# Patient Record
Sex: Female | Born: 1965 | Race: White | Hispanic: No | Marital: Married | Smoking: Never smoker
Health system: Southern US, Community
[De-identification: ages and names within clinical notes are randomized; demographics above are authoritative.]

## PROBLEM LIST (undated history)

## (undated) DIAGNOSIS — K589 Irritable bowel syndrome without diarrhea: Secondary | ICD-10-CM

## (undated) DIAGNOSIS — N920 Excessive and frequent menstruation with regular cycle: Secondary | ICD-10-CM

## (undated) DIAGNOSIS — E785 Hyperlipidemia, unspecified: Secondary | ICD-10-CM

## (undated) DIAGNOSIS — E538 Deficiency of other specified B group vitamins: Secondary | ICD-10-CM

## (undated) DIAGNOSIS — D219 Benign neoplasm of connective and other soft tissue, unspecified: Secondary | ICD-10-CM

## (undated) HISTORY — DX: Benign neoplasm of connective and other soft tissue, unspecified: D21.9

## (undated) HISTORY — DX: Excessive and frequent menstruation with regular cycle: N92.0

## (undated) HISTORY — DX: Deficiency of other specified B group vitamins: E53.8

## (undated) HISTORY — PX: ENDOMETRIAL ABLATION: SHX621

## (undated) HISTORY — DX: Morbid (severe) obesity due to excess calories: E66.01

## (undated) HISTORY — DX: Irritable bowel syndrome without diarrhea: K58.9

## (undated) HISTORY — DX: Hyperlipidemia, unspecified: E78.5

---

## 2006-01-18 ENCOUNTER — Inpatient Hospital Stay

## 2009-06-20 DIAGNOSIS — K589 Irritable bowel syndrome without diarrhea: Secondary | ICD-10-CM

## 2009-06-20 HISTORY — DX: Irritable bowel syndrome, unspecified: K58.9

## 2009-06-20 HISTORY — DX: Morbid (severe) obesity due to excess calories: E66.01

## 2009-07-11 ENCOUNTER — Inpatient Hospital Stay

## 2009-07-11 NOTE — Unmapped (Signed)
Signed by   LinkLogic on 07/16/2009 at 16:55:26  Patient: Karen Graves  Note: All result statuses are Final unless otherwise noted.    Tests: (1) MAMM-SCREENING DIRECT DIGITAL [phone]                 Negative (R)      [employer] Accession Number: [id number]                                      [facility]     Patient: Karen Graves, Karen Graves   DOB:     17-Nov-1965   MRN:     [id number]   FIN:       [id number]   Accn#:   [id number]                                       Mammography     Exam                       Exam Date/Time       Ordering Physician   MAMM-SCREENING DIRECT      07/11/2009 16:24 EDT STEPHENS, Marveen Reeks   DIGITAL     Reason for Exam   screen     Report   DIRECT DIGITAL BILATERAL SCREENING MAMMOGRAM WITH CAD 07/11/2009     CLINICAL INDICATION:  Breast cancer screening.     COMPARISON: 01/18/2006.     TECHNICAL FACTORS: Standard MLO and CC views of both breasts are obtained   using digital mammography and double read with R2 CAD ImageChecker.     FINDINGS:     There are scattered fibroglandular densities.     There is no significant interval change.     No focal mass, asymmetry, architectural distortion, or suspiciously   clustered microcalcification is seen.     IMPRESSION:     No mammographic evidence of malignancy.     RECOMMENDATION:     Routine annual bilateral screening mammogram in 1 year and annual clinical       breast examination.     Assessment:  1-Negative   Recommendation:  Normal interval follow-up             ********** VERIFIED REPORT **********     Dictated: 07/14/2009 10:44 pm     LEE M.D., Garret Reddish   Signed (Electronic Signature):  LEE M.D., SU-JU                 07/16/09   4:55   Transcribed by: Doristine Section   Technologist: EGW        EMR Routing to: CRAIG, JEFFREY L - copy to - [id number]    Note: An exclamation mark (!) indicates a result that was not dispersed into   the flowsheet.  Document Creation Date: 07/16/2009 4:55  PM  _______________________________________________________________________    (1) Order result status: Final  Collection or observation date-time: 07/11/2009 16:24:37  Requested date-time: 07/11/2009 16:09:00  Receipt date-time:   Reported date-time: 07/16/2009 16:55:15  Referring Physician: Renee Ramus  Ordering Physician:  Non-EMR Physician [facility])  Specimen Source: RAD&Rad Type  Source: QRS  Filler Order Number: [id number]  Lab site: [facility]

## 2010-07-14 ENCOUNTER — Inpatient Hospital Stay

## 2010-07-21 ENCOUNTER — Inpatient Hospital Stay

## 2010-09-17 DIAGNOSIS — N920 Excessive and frequent menstruation with regular cycle: Secondary | ICD-10-CM

## 2010-09-17 HISTORY — DX: Excessive and frequent menstruation with regular cycle: N92.0

## 2012-01-26 ENCOUNTER — Inpatient Hospital Stay

## 2012-03-13 ENCOUNTER — Inpatient Hospital Stay: Admit: 2012-03-13 | Payer: PRIVATE HEALTH INSURANCE

## 2012-03-13 DIAGNOSIS — Z1231 Encounter for screening mammogram for malignant neoplasm of breast: Secondary | ICD-10-CM

## 2013-04-13 ENCOUNTER — Ambulatory Visit: Payer: PRIVATE HEALTH INSURANCE

## 2013-05-04 ENCOUNTER — Inpatient Hospital Stay: Admit: 2013-05-04 | Payer: PRIVATE HEALTH INSURANCE

## 2013-05-04 DIAGNOSIS — Z1231 Encounter for screening mammogram for malignant neoplasm of breast: Secondary | ICD-10-CM

## 2014-07-26 ENCOUNTER — Inpatient Hospital Stay: Admit: 2014-07-26 | Payer: PRIVATE HEALTH INSURANCE

## 2014-07-26 DIAGNOSIS — Z1231 Encounter for screening mammogram for malignant neoplasm of breast: Secondary | ICD-10-CM

## 2015-02-17 ENCOUNTER — Emergency Department (HOSPITAL_COMMUNITY): Payer: BLUE CROSS/BLUE SHIELD

## 2015-02-17 ENCOUNTER — Emergency Department (HOSPITAL_COMMUNITY)
Admission: EM | Admit: 2015-02-17 | Discharge: 2015-02-17 | Disposition: A | Discharge status: Home / Self Care | Payer: BLUE CROSS/BLUE SHIELD | Attending: Emergency Medicine | Admitting: Emergency Medicine

## 2015-02-17 ENCOUNTER — Other Ambulatory Visit: Payer: Self-pay

## 2015-02-17 ENCOUNTER — Encounter (HOSPITAL_COMMUNITY): Payer: Self-pay | Admitting: Emergency Medicine

## 2015-02-17 DIAGNOSIS — Z79899 Other long term (current) drug therapy: Secondary | ICD-10-CM | POA: Diagnosis not present

## 2015-02-17 DIAGNOSIS — R1011 Right upper quadrant pain: Secondary | ICD-10-CM | POA: Insufficient documentation

## 2015-02-17 DIAGNOSIS — R109 Unspecified abdominal pain: Secondary | ICD-10-CM

## 2015-02-17 DIAGNOSIS — Z3202 Encounter for pregnancy test, result negative: Secondary | ICD-10-CM | POA: Insufficient documentation

## 2015-02-17 DIAGNOSIS — R1013 Epigastric pain: Secondary | ICD-10-CM | POA: Diagnosis present

## 2015-02-17 DIAGNOSIS — R52 Pain, unspecified: Secondary | ICD-10-CM

## 2015-02-17 LAB — COMPREHENSIVE METABOLIC PANEL
ALK PHOS: 104 U/L (ref 38–126)
ALT: 17 U/L (ref 14–54)
ANION GAP: 9 (ref 5–15)
AST: 14 U/L — ABNORMAL LOW (ref 15–41)
Albumin: 4.2 g/dL (ref 3.5–5.0)
BUN: 10 mg/dL (ref 6–20)
CALCIUM: 9 mg/dL (ref 8.9–10.3)
CO2: 27 mmol/L (ref 22–32)
Chloride: 105 mmol/L (ref 101–111)
Creatinine, Ser: 0.73 mg/dL (ref 0.44–1.00)
Glucose, Bld: 95 mg/dL (ref 65–99)
POTASSIUM: 4.1 mmol/L (ref 3.5–5.1)
SODIUM: 141 mmol/L (ref 135–145)
Total Bilirubin: 0.7 mg/dL (ref 0.3–1.2)
Total Protein: 7.7 g/dL (ref 6.5–8.1)

## 2015-02-17 LAB — URINALYSIS, ROUTINE W REFLEX MICROSCOPIC
BILIRUBIN URINE: NEGATIVE
GLUCOSE, UA: NEGATIVE mg/dL
HGB URINE DIPSTICK: NEGATIVE
Ketones, ur: NEGATIVE mg/dL
Leukocytes, UA: NEGATIVE
Nitrite: NEGATIVE
PH: 7 (ref 5.0–8.0)
PROTEIN: NEGATIVE mg/dL
Specific Gravity, Urine: 1.007 (ref 1.005–1.030)
Urobilinogen, UA: 0.2 mg/dL (ref 0.0–1.0)

## 2015-02-17 LAB — CBC WITH DIFFERENTIAL/PLATELET
BASOS PCT: 1 % (ref 0–1)
Basophils Absolute: 0.1 10*3/uL (ref 0.0–0.1)
EOS ABS: 0.1 10*3/uL (ref 0.0–0.7)
Eosinophils Relative: 1 % (ref 0–5)
HCT: 43.5 % (ref 36.0–46.0)
HEMOGLOBIN: 14.2 g/dL (ref 12.0–15.0)
Lymphocytes Relative: 24 % (ref 12–46)
Lymphs Abs: 2.6 10*3/uL (ref 0.7–4.0)
MCH: 30 pg (ref 26.0–34.0)
MCHC: 32.6 g/dL (ref 30.0–36.0)
MCV: 91.8 fL (ref 78.0–100.0)
MONOS PCT: 10 % (ref 3–12)
Monocytes Absolute: 1.1 10*3/uL — ABNORMAL HIGH (ref 0.1–1.0)
Neutro Abs: 7 10*3/uL (ref 1.7–7.7)
Neutrophils Relative %: 64 % (ref 43–77)
Platelets: 332 10*3/uL (ref 150–400)
RBC: 4.74 MIL/uL (ref 3.87–5.11)
RDW: 12.7 % (ref 11.5–15.5)
WBC: 10.9 10*3/uL — ABNORMAL HIGH (ref 4.0–10.5)

## 2015-02-17 LAB — LIPASE, BLOOD: Lipase: 24 U/L (ref 22–51)

## 2015-02-17 LAB — PREGNANCY, URINE: Preg Test, Ur: NEGATIVE

## 2015-02-17 MED ORDER — ONDANSETRON 4 MG PO TBDP: ORAL_TABLET | ORAL | Status: DC

## 2015-02-17 MED ORDER — HYDROCODONE-ACETAMINOPHEN 5-325 MG PO TABS: 1.0000 | ORAL_TABLET | Freq: Four times a day (QID) | ORAL | Status: DC | PRN

## 2015-02-17 NOTE — ED Notes (Signed)
MD in room talking with patient I will collect labs when they leave.

## 2015-02-17 NOTE — ED Notes (Signed)
Pt requests [place] scan images/report. Radiology department contacted. Pt given scan results. No other c/c. AVS explained in detail. Knows to follow up with local surgeon when she returns to [place].

## 2015-02-17 NOTE — ED Notes (Signed)
Radiology contacted regarding copy of [place].

## 2015-02-17 NOTE — Discharge Instructions (Signed)
Follow up with a general surgeon at your home town.  Get seen sooner if problems

## 2015-02-17 NOTE — ED Notes (Signed)
Pt c/o RUQ abdominal pain and constipation onset Saturday night, denies n/v/diarrhea.

## 2015-02-17 NOTE — ED Provider Notes (Signed)
CSN: [id number]     Arrival date & time 02/17/15  1018 History   First MD Initiated Contact with Patient 02/17/15 1120     Chief Complaint  Patient presents with  . Abdominal Pain     (Consider location/radiation/quality/duration/timing/severity/associated sxs/prior Treatment) Patient is a 49 y.o. female presenting with abdominal pain. The history is provided by the patient (the pt complains of ruq abd pain. worse with eating).  Abdominal Pain Pain location:  Epigastric Pain quality: aching   Pain radiates to:  Does not radiate Pain severity:  Moderate Onset quality:  Sudden Timing:  Intermittent Chronicity:  Recurrent Context: not alcohol use   Associated symptoms: no chest pain, no cough, no diarrhea, no fatigue and no hematuria     History reviewed. No pertinent past medical history. History reviewed. No pertinent past surgical history. History reviewed. No pertinent family history. History  Substance Use Topics  . Smoking status: Never Smoker   . Smokeless tobacco: Not on file  . Alcohol Use: No   OB History    No data available     Review of Systems  Constitutional: Negative for appetite change and fatigue.  HENT: Negative for congestion, ear discharge and sinus pressure.   Eyes: Negative for discharge.  Respiratory: Negative for cough.   Cardiovascular: Negative for chest pain.  Gastrointestinal: Positive for abdominal pain. Negative for diarrhea.  Genitourinary: Negative for frequency and hematuria.  Musculoskeletal: Negative for back pain.  Skin: Negative for rash.  Neurological: Negative for seizures and headaches.  Psychiatric/Behavioral: Negative for hallucinations.      Allergies  Tetracyclines & related  Home Medications   Prior to Admission medications   Medication Sig Start Date End Date Taking? Authorizing Provider  cetirizine (ZYRTEC) 10 MG tablet Take 10 mg by mouth daily as needed for allergies.   Yes Historical Provider, MD   Cholecalciferol (VITAMIN D PO) Take 1 tablet by mouth daily.   Yes Historical Provider, MD  Cholecalciferol (VITAMIN D) 2000 UNITS CAPS Take 1 tablet by mouth daily. 06/15/11  Yes Historical Provider, MD  HYDROcodone-acetaminophen (NORCO/VICODIN) 5-325 MG per tablet Take 1 tablet by mouth every 6 (six) hours as needed for moderate pain. 02/17/15   Milton Ferguson, MD  Multiple Vitamins-Minerals (MULTIVITAMIN WITH MINERALS) tablet Take 1 tablet by mouth daily.   Yes Historical Provider, MD  ondansetron (ZOFRAN ODT) 4 MG disintegrating tablet 4mg  ODT q4 hours prn nausea/vomit 02/17/15   Milton Ferguson, MD   BP 122/76 mmHg  Pulse 62  Temp(Src) 98.4 F (36.9 C) (Oral)  Resp 16  SpO2 97% Physical Exam  Constitutional: She is oriented to person, place, and time. She appears well-developed.  HENT:  Head: Normocephalic.  Eyes: Conjunctivae and EOM are normal. No scleral icterus.  Neck: Neck supple. No thyromegaly present.  Cardiovascular: Normal rate and regular rhythm.  Exam reveals no gallop and no friction rub.   No murmur heard. Pulmonary/Chest: No stridor. She has no wheezes. She has no rales. She exhibits no tenderness.  Abdominal: She exhibits no distension. There is tenderness. There is no rebound.  Mild ruq tender  Musculoskeletal: Normal range of motion. She exhibits no edema.  Lymphadenopathy:    She has no cervical adenopathy.  Neurological: She is oriented to person, place, and time. She exhibits normal muscle tone. Coordination normal.  Skin: No rash noted. No erythema.  Psychiatric: She has a normal mood and affect. Her behavior is normal.    ED Course  Procedures (including critical care time)  Labs Review Labs Reviewed  CBC WITH DIFFERENTIAL/PLATELET - Abnormal; Notable for the following:    WBC 10.9 (*)    Monocytes Absolute 1.1 (*)    All other components within normal limits  COMPREHENSIVE METABOLIC PANEL - Abnormal; Notable for the following:    AST 14 (*)    All other  components within normal limits  LIPASE, BLOOD  URINALYSIS, ROUTINE W REFLEX MICROSCOPIC (NOT AT [facility])  PREGNANCY, URINE    Imaging Review Dg Chest 2 View  02/17/2015   CLINICAL DATA:  New right lateral chest pain starting Saturday night.  EXAM: CHEST  2 VIEW  COMPARISON:  None.  FINDINGS: Few linear opacities in the right lower lung with mild volume loss. Elevation the right diaphragm has the appearance of eventration in the lateral projection. No edema, effusion, or pneumothorax. Normal heart size and mediastinal contours. No acute osseous findings.  IMPRESSION: Mild right basilar atelectasis.   Electronically Signed   By: Monte Fantasia M.D.   On: 02/17/2015 12:14   US Abdomen Complete  02/17/2015   CLINICAL DATA:  Right upper quadrant pain  EXAM: ULTRASOUND ABDOMEN COMPLETE  COMPARISON:  None  FINDINGS: Gallbladder: Large stone identified within the gallbladder measures up to 1.7 cm. No gallbladder wall thickening or pericholecystic fluid.  Common bile duct: Diameter: 3 mm.  Liver: No focal lesion identified. Within normal limits in parenchymal echogenicity.  IVC: No abnormality visualized.  Pancreas: Visualized portion unremarkable.  Spleen: Size and appearance within normal limits.  Right Kidney: Length: 10.3 cm. Echogenicity within normal limits. No mass or hydronephrosis visualized.  Left Kidney: Length: 12.0 cm. Echogenicity within normal limits. No mass or hydronephrosis visualized.  Abdominal aorta: No aneurysm visualized.  Other findings: Right plural effusion noted.  IMPRESSION: 1. No acute findings. 2. Gallstones.   Electronically Signed   By: Kerby Moors M.D.   On: 02/17/2015 14:09     EKG Interpretation   Date/Time:  Monday February 17 2015 10:43:26 EDT Ventricular Rate:  67 PR Interval:  154 QRS Duration: 92 QT Interval:  412 QTC Calculation: 435 R Axis:   52 Text Interpretation:  Sinus rhythm Low voltage, precordial leads Otherwise  normal ECG No old tracing to compare  Confirmed by GOLDSTON  MD, SCOTT  ([id number]) on 02/17/2015 10:46:19 AM      MDM   Final diagnoses:  Pain  Abdominal pain in female    abd pain with galls stones,  No cholecystitis.  Pt to follow up with surgeon    Milton Ferguson, MD 02/17/15 1450

## 2015-05-08 ENCOUNTER — Ambulatory Visit: Admit: 2015-05-08 | Discharge: 2015-05-08 | Payer: PRIVATE HEALTH INSURANCE | Attending: Family

## 2015-05-08 DIAGNOSIS — L719 Rosacea, unspecified: Secondary | ICD-10-CM

## 2015-05-08 NOTE — Unmapped (Signed)
Chief Complaint   Patient presents with   ??? Skin Lesion     skin exam       HPI: Pt is 49 y.o.white female with asymptomatic redness on face x years. Used to use finacea with no change. Gets more red with ETOH and heat. No current treatments.     ROS: Denies fever and chills. Reports seasonal allergies.     Derm History:NPV (no referral)  (- ) Family history of melanoma  (- ) Personal history of NMSC or MM  (+ ) sunburns easily  (+ ) uses sunscreen  (- ) history of tanning bed use    Physical Examination:FSE today     Examination was performed of the following were unremarkable: scalp/hair, conjunctivae/eyelids, gums/teeth/lips, neck, breast/axilla/chest, abdomen, back, RUE, LUE, RLE, LLE, nails/digits and genitalia/groin/buttocks    Abnormalities noted include: head/face     Central face and chin with mild erythema and few telangiectasias    A & P:     Subtype I rosacea  Plan:  -educated on diagnosis in detail  -briefly discussed laser  -defers any tx today

## 2016-09-15 DIAGNOSIS — M9903 Segmental and somatic dysfunction of lumbar region: Secondary | ICD-10-CM | POA: Diagnosis not present

## 2016-09-15 DIAGNOSIS — M47816 Spondylosis without myelopathy or radiculopathy, lumbar region: Secondary | ICD-10-CM | POA: Diagnosis not present

## 2016-09-16 DIAGNOSIS — M9903 Segmental and somatic dysfunction of lumbar region: Secondary | ICD-10-CM | POA: Diagnosis not present

## 2016-09-16 DIAGNOSIS — M47816 Spondylosis without myelopathy or radiculopathy, lumbar region: Secondary | ICD-10-CM | POA: Diagnosis not present

## 2016-09-20 DIAGNOSIS — M9903 Segmental and somatic dysfunction of lumbar region: Secondary | ICD-10-CM | POA: Diagnosis not present

## 2016-09-20 DIAGNOSIS — M47816 Spondylosis without myelopathy or radiculopathy, lumbar region: Secondary | ICD-10-CM | POA: Diagnosis not present

## 2016-09-21 DIAGNOSIS — M9903 Segmental and somatic dysfunction of lumbar region: Secondary | ICD-10-CM | POA: Diagnosis not present

## 2016-09-21 DIAGNOSIS — M47816 Spondylosis without myelopathy or radiculopathy, lumbar region: Secondary | ICD-10-CM | POA: Diagnosis not present

## 2016-09-22 DIAGNOSIS — M9903 Segmental and somatic dysfunction of lumbar region: Secondary | ICD-10-CM | POA: Diagnosis not present

## 2016-09-22 DIAGNOSIS — M47816 Spondylosis without myelopathy or radiculopathy, lumbar region: Secondary | ICD-10-CM | POA: Diagnosis not present

## 2016-09-27 DIAGNOSIS — M9903 Segmental and somatic dysfunction of lumbar region: Secondary | ICD-10-CM | POA: Diagnosis not present

## 2016-09-27 DIAGNOSIS — M47816 Spondylosis without myelopathy or radiculopathy, lumbar region: Secondary | ICD-10-CM | POA: Diagnosis not present

## 2016-09-28 DIAGNOSIS — M9903 Segmental and somatic dysfunction of lumbar region: Secondary | ICD-10-CM | POA: Diagnosis not present

## 2016-09-28 DIAGNOSIS — M47816 Spondylosis without myelopathy or radiculopathy, lumbar region: Secondary | ICD-10-CM | POA: Diagnosis not present

## 2016-10-04 DIAGNOSIS — M47816 Spondylosis without myelopathy or radiculopathy, lumbar region: Secondary | ICD-10-CM | POA: Diagnosis not present

## 2016-10-04 DIAGNOSIS — M9903 Segmental and somatic dysfunction of lumbar region: Secondary | ICD-10-CM | POA: Diagnosis not present

## 2016-10-05 DIAGNOSIS — M47816 Spondylosis without myelopathy or radiculopathy, lumbar region: Secondary | ICD-10-CM | POA: Diagnosis not present

## 2016-10-05 DIAGNOSIS — M9903 Segmental and somatic dysfunction of lumbar region: Secondary | ICD-10-CM | POA: Diagnosis not present

## 2016-10-06 DIAGNOSIS — M9903 Segmental and somatic dysfunction of lumbar region: Secondary | ICD-10-CM | POA: Diagnosis not present

## 2016-10-06 DIAGNOSIS — M47816 Spondylosis without myelopathy or radiculopathy, lumbar region: Secondary | ICD-10-CM | POA: Diagnosis not present

## 2016-10-11 DIAGNOSIS — M9903 Segmental and somatic dysfunction of lumbar region: Secondary | ICD-10-CM | POA: Diagnosis not present

## 2016-10-11 DIAGNOSIS — M47816 Spondylosis without myelopathy or radiculopathy, lumbar region: Secondary | ICD-10-CM | POA: Diagnosis not present

## 2016-10-13 DIAGNOSIS — M47816 Spondylosis without myelopathy or radiculopathy, lumbar region: Secondary | ICD-10-CM | POA: Diagnosis not present

## 2016-10-13 DIAGNOSIS — M9903 Segmental and somatic dysfunction of lumbar region: Secondary | ICD-10-CM | POA: Diagnosis not present

## 2016-10-18 DIAGNOSIS — M9903 Segmental and somatic dysfunction of lumbar region: Secondary | ICD-10-CM | POA: Diagnosis not present

## 2016-10-18 DIAGNOSIS — M47816 Spondylosis without myelopathy or radiculopathy, lumbar region: Secondary | ICD-10-CM | POA: Diagnosis not present

## 2017-03-10 DIAGNOSIS — D2272 Melanocytic nevi of left lower limb, including hip: Secondary | ICD-10-CM | POA: Diagnosis not present

## 2017-03-10 DIAGNOSIS — D225 Melanocytic nevi of trunk: Secondary | ICD-10-CM | POA: Diagnosis not present

## 2017-03-10 DIAGNOSIS — L720 Epidermal cyst: Secondary | ICD-10-CM | POA: Diagnosis not present

## 2017-03-10 DIAGNOSIS — L918 Other hypertrophic disorders of the skin: Secondary | ICD-10-CM | POA: Diagnosis not present

## 2017-04-12 ENCOUNTER — Telehealth: Payer: Self-pay | Admitting: Emergency Medicine

## 2017-04-12 NOTE — Telephone Encounter (Signed)
Pre visit call attempted unable to contact patient number provided is a non working number.

## 2017-04-13 ENCOUNTER — Encounter: Payer: Self-pay | Admitting: Family Medicine

## 2017-04-13 ENCOUNTER — Ambulatory Visit (INDEPENDENT_AMBULATORY_CARE_PROVIDER_SITE_OTHER): Payer: BLUE CROSS/BLUE SHIELD | Admitting: Family Medicine

## 2017-04-13 VITALS — BP 126/80 | HR 66 | Temp 98.3°F | Ht 68.0 in | Wt 278.4 lb

## 2017-04-13 DIAGNOSIS — E539 Vitamin B deficiency, unspecified: Secondary | ICD-10-CM | POA: Diagnosis not present

## 2017-04-13 DIAGNOSIS — E538 Deficiency of other specified B group vitamins: Secondary | ICD-10-CM

## 2017-04-13 DIAGNOSIS — E559 Vitamin D deficiency, unspecified: Secondary | ICD-10-CM

## 2017-04-13 DIAGNOSIS — E669 Obesity, unspecified: Secondary | ICD-10-CM | POA: Diagnosis not present

## 2017-04-13 DIAGNOSIS — Z0000 Encounter for general adult medical examination without abnormal findings: Secondary | ICD-10-CM | POA: Diagnosis not present

## 2017-04-13 DIAGNOSIS — E785 Hyperlipidemia, unspecified: Secondary | ICD-10-CM

## 2017-04-13 DIAGNOSIS — Z6841 Body Mass Index (BMI) 40.0 and over, adult: Secondary | ICD-10-CM

## 2017-04-13 DIAGNOSIS — IMO0001 Reserved for inherently not codable concepts without codable children: Secondary | ICD-10-CM

## 2017-04-13 HISTORY — DX: Hyperlipidemia, unspecified: E78.5

## 2017-04-13 HISTORY — DX: Deficiency of other specified B group vitamins: E53.8

## 2017-04-13 NOTE — Progress Notes (Signed)
SERENE KOPF is a 51 y.o. female is here to [facility].   Patient Care Team: Briscoe Deutscher, DO as PCP - General (Family Medicine)   History of Present Illness:   Gertie Exon, CMA, acting as scribe for Dr. Juleen China.  HPI: The patient presents for a new patient CPE. She has no concerns. We reviewed her PMHx and her chart was updated today.  Wt Readings from Last 3 Encounters:  04/13/17 278 lb 6.4 oz (126.3 kg)   Health Maintenance Due  Topic Date Due  . HIV Screening  10/05/1982  . TETANUS/TDAP  10/05/1986  . INFLUENZA VACCINE  04/13/2017   Depression screen PHQ 2/9 04/13/2017  Decreased Interest 0  Down, Depressed, Hopeless 0  PHQ - 2 Score 0   PMHx, SurgHx, SocialHx, Medications, and Allergies were reviewed in the Visit Navigator and updated as appropriate.   Past Medical History:  Diagnosis Date  . Hyperlipidemia 04/13/2017  . IBS (irritable bowel syndrome) 06/20/2009  . Menorrhagia 09/17/2010  . Morbid obesity ([place]) 06/20/2009  . Vitamin B 12 deficiency 04/13/2017   History reviewed. No pertinent surgical history.  Family History  Problem Relation Age of Onset  . Hyperlipidemia Mother   . Osteoporosis Mother   . Diabetes Father   . Mental illness Father   . Mental illness Maternal Grandmother    Social History  Substance Use Topics  . Smoking status: Never Smoker  . Smokeless tobacco: Never Used  . Alcohol use 0.6 oz/week    1 Glasses of wine per week   Current Medications and Allergies:   .  BIOTIN PO, Take by mouth., Disp: , Rfl:  .  cetirizine (ZYRTEC) 10 MG tablet, Take 10 mg by mouth daily as needed for allergies., Disp: , Rfl:  .  Cholecalciferol (VITAMIN D) 2000 UNITS CAPS, Take 1 tablet by mouth daily., Disp: , Rfl:  .  Multiple Vitamins-Minerals (MULTIVITAMIN WITH MINERALS) tablet, Take 1 tablet by mouth daily., Disp: , Rfl:  .  Probiotic Product (PROBIOTIC DAILY PO), Take by mouth., Disp: , Rfl:  .  TURMERIC PO, Take by mouth., Disp: , Rfl:    Allergies  Allergen Reactions  . Tetracyclines & Related Rash   Review of Systems:   Pertinent items are noted in the HPI. Otherwise, ROS is negative.  Vitals:   Vitals:   04/13/17 0924  BP: 126/80  Pulse: 66  Temp: 98.3 F (36.8 C)  TempSrc: Oral  SpO2: 95%  Weight: 278 lb 6.4 oz (126.3 kg)  Height: 5\' 8"  (1.727 m)     Body mass index is 42.33 kg/m.  Physical Exam:   Physical Exam  Constitutional: She is oriented to person, place, and time. She appears well-developed and well-nourished. No distress.  HENT:  Head: Normocephalic and atraumatic.  Right Ear: External ear normal.  Left Ear: External ear normal.  Nose: Nose normal.  Mouth/Throat: Oropharynx is clear and moist.  Eyes: Pupils are equal, round, and reactive to light. Conjunctivae and EOM are normal.  Neck: Normal range of motion. Neck supple. No thyromegaly present.  Cardiovascular: Normal rate, regular rhythm, normal heart sounds and intact distal pulses.   Pulmonary/Chest: Effort normal and breath sounds normal.  Abdominal: Soft. Bowel sounds are normal.  Musculoskeletal: Normal range of motion.  Lymphadenopathy:    She has no cervical adenopathy.  Neurological: She is alert and oriented to person, place, and time.  Skin: Skin is warm. Capillary refill takes less than 2 seconds.  Psychiatric: She  has a normal mood and affect. Her behavior is normal.  Nursing note and vitals reviewed.  Assessment and Plan:   Quentin was seen today for establish care.  Diagnoses and all orders for this visit:  Routine physical examination -     CBC with Differential/Platelet; Future -     Comprehensive metabolic panel; Future -     TSH; Future -     Vitamin B12; Future -     VITAMIN D 25 Hydroxy (Vit-D Deficiency, Fractures); Future -     Hemoglobin A1c; Future -     Ambulatory referral to Obstetrics / Gynecology -     Lipid panel; Future  Class 3 obesity without serious comorbidity with body mass index (BMI)  of 40.0 to 44.9 in adult, unspecified obesity type ([place]) Comments: The patient is asked to make an attempt to improve diet and exercise patterns to aid in medical management of this problem.  Orders: -     CBC with Differential/Platelet; Future -     Comprehensive metabolic panel; Future -     TSH; Future -     Vitamin B12; Future -     VITAMIN D 25 Hydroxy (Vit-D Deficiency, Fractures); Future -     Hemoglobin A1c; Future -     Ambulatory referral to Obstetrics / Gynecology -     Lipid panel; Future  Vitamin B deficiency -     Vitamin B12; Future  Vitamin D deficiency -     VITAMIN D 25 Hydroxy (Vit-D Deficiency, Fractures); Future   . Reviewed expectations re: course of current medical issues. . Discussed self-management of symptoms. . Outlined signs and symptoms indicating need for more acute intervention. . Patient verbalized understanding and all questions were answered. Marland Kitchen Health Maintenance issues including appropriate healthy diet, exercise, and smoking avoidance were discussed with patient. . See orders for this visit as documented in the electronic medical record. . Patient received an After Visit Summary.  CMA served as Education administrator during this visit. History, Physical, and Plan performed by medical provider. The above documentation has been reviewed and is accurate and complete. Briscoe Deutscher, D.O.  Briscoe Deutscher, DO [place] [place] 04/16/2017  No future appointments.

## 2017-04-14 ENCOUNTER — Telehealth: Payer: Self-pay | Admitting: Obstetrics and Gynecology

## 2017-04-14 NOTE — Telephone Encounter (Signed)
Called and left a message for patient to call back to schedule a new patient doctor referral for AEX. She was in the car driving and will call back to schedule.

## 2017-04-15 ENCOUNTER — Other Ambulatory Visit (INDEPENDENT_AMBULATORY_CARE_PROVIDER_SITE_OTHER): Payer: BLUE CROSS/BLUE SHIELD

## 2017-04-15 ENCOUNTER — Telehealth: Payer: Self-pay | Admitting: *Deleted

## 2017-04-15 DIAGNOSIS — Z0000 Encounter for general adult medical examination without abnormal findings: Secondary | ICD-10-CM | POA: Diagnosis not present

## 2017-04-15 DIAGNOSIS — E559 Vitamin D deficiency, unspecified: Secondary | ICD-10-CM | POA: Diagnosis not present

## 2017-04-15 DIAGNOSIS — E539 Vitamin B deficiency, unspecified: Secondary | ICD-10-CM | POA: Diagnosis not present

## 2017-04-15 DIAGNOSIS — Z6841 Body Mass Index (BMI) 40.0 and over, adult: Secondary | ICD-10-CM

## 2017-04-15 DIAGNOSIS — E669 Obesity, unspecified: Secondary | ICD-10-CM | POA: Diagnosis not present

## 2017-04-15 DIAGNOSIS — IMO0001 Reserved for inherently not codable concepts without codable children: Secondary | ICD-10-CM

## 2017-04-15 LAB — LIPID PANEL
Cholesterol: 193 mg/dL (ref 0–200)
HDL: 41.9 mg/dL (ref >39.00)
LDL Cholesterol: 127 mg/dL — ABNORMAL HIGH (ref 0–99)
NonHDL: 150.82
Total CHOL/HDL Ratio: 5
Triglycerides: 120 mg/dL (ref 0.0–149.0)
VLDL: 24 mg/dL (ref 0.0–40.0)

## 2017-04-15 LAB — CBC WITH DIFFERENTIAL/PLATELET
Basophils Absolute: 0.1 10*3/uL (ref 0.0–0.1)
Basophils Relative: 0.6 % (ref 0.0–3.0)
Eosinophils Absolute: 0.2 10*3/uL (ref 0.0–0.7)
Eosinophils Relative: 1.5 % (ref 0.0–5.0)
HCT: 41.2 % (ref 36.0–46.0)
Hemoglobin: 13.8 g/dL (ref 12.0–15.0)
Lymphocytes Relative: 22.3 % (ref 12.0–46.0)
Lymphs Abs: 2.2 10*3/uL (ref 0.7–4.0)
MCHC: 33.6 g/dL (ref 30.0–36.0)
MCV: 91.9 fl (ref 78.0–100.0)
Monocytes Absolute: 0.9 10*3/uL (ref 0.1–1.0)
Monocytes Relative: 9.3 % (ref 3.0–12.0)
Neutro Abs: 6.6 10*3/uL (ref 1.4–7.7)
Neutrophils Relative %: 66.3 % (ref 43.0–77.0)
Platelets: 308 10*3/uL (ref 150.0–400.0)
RBC: 4.48 Mil/uL (ref 3.87–5.11)
RDW: 13 % (ref 11.5–15.5)
WBC: 10 10*3/uL (ref 4.0–10.5)

## 2017-04-15 LAB — HEMOGLOBIN A1C: Hgb A1c MFr Bld: 5.3 % (ref 4.6–6.5)

## 2017-04-15 LAB — COMPREHENSIVE METABOLIC PANEL
ALT: 18 U/L (ref 0–35)
AST: 13 U/L (ref 0–37)
Albumin: 3.9 g/dL (ref 3.5–5.2)
Alkaline Phosphatase: 92 U/L (ref 39–117)
BUN: 12 mg/dL (ref 6–23)
CO2: 30 mEq/L (ref 19–32)
Calcium: 8.7 mg/dL (ref 8.4–10.5)
Chloride: 103 mEq/L (ref 96–112)
Creatinine, Ser: 0.91 mg/dL (ref 0.40–1.20)
GFR: 69.68 mL/min (ref >60.00)
Glucose, Bld: 93 mg/dL (ref 70–99)
Potassium: 4.5 mEq/L (ref 3.5–5.1)
Sodium: 138 mEq/L (ref 135–145)
Total Bilirubin: 0.5 mg/dL (ref 0.2–1.2)
Total Protein: 6.6 g/dL (ref 6.0–8.3)

## 2017-04-15 LAB — TSH: TSH: 2.53 u[IU]/mL (ref 0.35–4.50)

## 2017-04-15 LAB — VITAMIN D 25 HYDROXY (VIT D DEFICIENCY, FRACTURES): VITD: 44.01 ng/mL (ref 30.00–100.00)

## 2017-04-15 LAB — VITAMIN B12: Vitamin B-12: 285 pg/mL (ref 211–911)

## 2017-04-15 NOTE — Telephone Encounter (Signed)
Return call to [facility].

## 2017-04-15 NOTE — Telephone Encounter (Signed)
Called and left a message for patient to call back to schedule a new patient doctor referral. °

## 2017-04-15 NOTE — Telephone Encounter (Signed)
Pt calling back, said she can not see her results on My Chart. Told pt I will go ahead and release the labs now so she can see them. Pt verbalized understanding. Labs released.

## 2017-04-16 ENCOUNTER — Encounter: Payer: Self-pay | Admitting: Family Medicine

## 2017-04-21 DIAGNOSIS — Z419 Encounter for procedure for purposes other than remedying health state, unspecified: Secondary | ICD-10-CM | POA: Diagnosis not present

## 2017-04-21 DIAGNOSIS — L918 Other hypertrophic disorders of the skin: Secondary | ICD-10-CM | POA: Diagnosis not present

## 2017-05-31 ENCOUNTER — Ambulatory Visit (INDEPENDENT_AMBULATORY_CARE_PROVIDER_SITE_OTHER): Payer: BLUE CROSS/BLUE SHIELD | Admitting: Obstetrics and Gynecology

## 2017-05-31 ENCOUNTER — Ambulatory Visit: Payer: BLUE CROSS/BLUE SHIELD | Admitting: Obstetrics and Gynecology

## 2017-05-31 ENCOUNTER — Encounter: Payer: Self-pay | Admitting: Obstetrics and Gynecology

## 2017-05-31 VITALS — BP 142/80 | HR 64 | Resp 18 | Ht 67.0 in | Wt 275.0 lb

## 2017-05-31 DIAGNOSIS — Z124 Encounter for screening for malignant neoplasm of cervix: Secondary | ICD-10-CM | POA: Diagnosis not present

## 2017-05-31 DIAGNOSIS — Z01419 Encounter for gynecological examination (general) (routine) without abnormal findings: Secondary | ICD-10-CM | POA: Diagnosis not present

## 2017-05-31 NOTE — Patient Instructions (Signed)

## 2017-05-31 NOTE — Progress Notes (Signed)
51 y.o. [id number] MarriedCaucasianF here for annual exam.  She had an endometrial ablation 10 + years ago, no cycles or spotting since then. She is having some hot flashes, coming and going, so far tolerable. Waking up hot at night some night, no sweats. Sexually active, no pain.     No LMP recorded. Patient has had an ablation.          Sexually active: Yes.    The current method of family planning is none.    Exercising: Yes.    walking Smoker:  no  Health Maintenance: Pap:  Fall 2016 WNL per patient  History of abnormal Pap:  no MMG:  Fall 2016 WNL Per patient  Colonoscopy:  Never BMD:   Never TDaP:  Unsure, she will check.   Gardasil: N/A   reports that she has never smoked. She has never used smokeless tobacco. She reports that she drinks about 0.6 oz of alcohol per week . She reports that she does not use drugs. kids are 70 and 70 (son and daughter). Moved back last year, family is here. Homemaker.   Past Medical History:  Diagnosis Date  . Fibroid   . Hyperlipidemia 04/13/2017  . IBS (irritable bowel syndrome) 06/20/2009  . Menorrhagia 09/17/2010  . Morbid obesity ([place]) 06/20/2009  . Vitamin B 12 deficiency 04/13/2017    Past Surgical History:  Procedure Laterality Date  . ENDOMETRIAL ABLATION      Current Outpatient Prescriptions  Medication Sig Dispense Refill  . BIOTIN PO Take by mouth.    . cetirizine (ZYRTEC) 10 MG tablet Take 10 mg by mouth daily as needed for allergies.    . Cholecalciferol (VITAMIN D) 2000 UNITS CAPS Take 1 tablet by mouth daily.    . Multiple Vitamins-Minerals (MULTIVITAMIN WITH MINERALS) tablet Take 1 tablet by mouth daily.    . Omega-3 Fatty Acids (FISH OIL) 1000 MG CAPS Take by mouth.    . Probiotic Product (PROBIOTIC DAILY PO) Take by mouth.    . TURMERIC PO Take by mouth.     No current facility-administered medications for this visit.     Family History  Problem Relation Age of Onset  . Hyperlipidemia Mother   . Osteoporosis Mother   .  Diabetes Father   . Mental illness Father   . Mental illness Maternal Grandmother   Mom did have a hip fracture at 2  Review of Systems  Constitutional: Negative.   HENT: Negative.   Eyes: Negative.   Respiratory: Negative.   Cardiovascular: Negative.   Gastrointestinal: Negative.   Endocrine: Negative.   Genitourinary: Negative.   Musculoskeletal: Negative.   Skin: Negative.   Allergic/Immunologic: Negative.   Neurological: Negative.   Psychiatric/Behavioral: Negative.     Exam:   BP (!) 142/80 (BP Location: Right Arm, Patient Position: Sitting, Cuff Size: Large)   Pulse 64   Resp 18   Ht 5\' 7"  (1.702 m)   Wt 275 lb (124.7 kg)   BMI 43.07 kg/m   Weight change: @WEIGHTCHANGE @ Height:   Height: 5\' 7"  (170.2 cm)  Ht Readings from Last 3 Encounters:  05/31/17 5\' 7"  (1.702 m)  04/13/17 5\' 8"  (1.727 m)    General appearance: alert, cooperative and appears stated age Head: Normocephalic, without obvious abnormality, atraumatic Neck: no adenopathy, supple, symmetrical, trachea midline and thyroid normal to inspection and palpation Lungs: clear to auscultation bilaterally Cardiovascular: regular rate and rhythm Breasts: normal appearance, no masses or tenderness Abdomen: soft, non-tender; non distended,  no  masses,  no organomegaly Extremities: extremities normal, atraumatic, no cyanosis or edema Skin: Skin color, texture, turgor normal. No rashes or lesions Lymph nodes: Cervical, supraclavicular, and axillary nodes normal. No abnormal inguinal nodes palpated Neurologic: Grossly normal   Pelvic: External genitalia:  no lesions              Urethra:  normal appearing urethra with no masses, tenderness or lesions              Bartholins and Skenes: normal                 Vagina: normal appearing vagina with normal color and discharge, no lesions              Cervix: no lesions               Bimanual Exam:  Uterus:  normal size, contour, position, consistency, mobility,  non-tender and anteverted              Adnexa: no mass, fullness, tenderness               Rectovaginal: Confirms               Anus:  normal sphincter tone, no lesions  Chaperone was present for exam.  A:  Well Woman with normal exam  Mild vasomotor symptoms, discussed behavioral changes to help, call if worsening  P:   Pap with hpv  Mammogram  Discussed colonoscopy (she may do cologard)  Discussed breast self exam  Discussed calcium and vit D intake  Labs with her primary

## 2017-06-01 ENCOUNTER — Other Ambulatory Visit (HOSPITAL_COMMUNITY)
Admission: RE | Admit: 2017-06-01 | Discharge: 2017-06-01 | Disposition: A | Discharge status: Home / Self Care | Payer: BLUE CROSS/BLUE SHIELD | Source: Ambulatory Visit | Attending: Obstetrics and Gynecology | Admitting: Obstetrics and Gynecology

## 2017-06-01 DIAGNOSIS — Z124 Encounter for screening for malignant neoplasm of cervix: Secondary | ICD-10-CM | POA: Diagnosis not present

## 2017-06-01 DIAGNOSIS — Z01419 Encounter for gynecological examination (general) (routine) without abnormal findings: Secondary | ICD-10-CM | POA: Diagnosis not present

## 2017-06-01 NOTE — Addendum Note (Signed)
Addended by: Dorothy Spark on: 06/01/2017 05:31 PM   Modules accepted: Orders

## 2017-06-06 LAB — CYTOLOGY - PAP
Diagnosis: NEGATIVE
HPV (WINDOPATH): NOT DETECTED

## 2017-06-10 ENCOUNTER — Other Ambulatory Visit: Payer: Self-pay | Admitting: Obstetrics and Gynecology

## 2017-06-10 DIAGNOSIS — Z1231 Encounter for screening mammogram for malignant neoplasm of breast: Secondary | ICD-10-CM

## 2017-06-21 ENCOUNTER — Ambulatory Visit: Payer: BLUE CROSS/BLUE SHIELD

## 2017-07-06 ENCOUNTER — Ambulatory Visit: Payer: BLUE CROSS/BLUE SHIELD

## 2017-07-28 ENCOUNTER — Ambulatory Visit: Payer: BLUE CROSS/BLUE SHIELD

## 2017-07-28 ENCOUNTER — Ambulatory Visit
Admission: RE | Admit: 2017-07-28 | Discharge: 2017-07-28 | Disposition: A | Discharge status: Home / Self Care | Payer: BLUE CROSS/BLUE SHIELD | Source: Ambulatory Visit | Attending: Obstetrics and Gynecology | Admitting: Obstetrics and Gynecology

## 2017-07-28 DIAGNOSIS — Z1231 Encounter for screening mammogram for malignant neoplasm of breast: Secondary | ICD-10-CM | POA: Diagnosis not present

## 2017-10-14 ENCOUNTER — Encounter: Payer: Self-pay | Admitting: Family Medicine

## 2017-10-24 ENCOUNTER — Encounter: Payer: Self-pay | Admitting: Family Medicine

## 2017-10-24 ENCOUNTER — Ambulatory Visit (INDEPENDENT_AMBULATORY_CARE_PROVIDER_SITE_OTHER): Payer: BLUE CROSS/BLUE SHIELD | Admitting: Family Medicine

## 2017-10-24 VITALS — BP 130/76 | HR 96 | Temp 98.6°F | Wt 269.4 lb

## 2017-10-24 DIAGNOSIS — K602 Anal fissure, unspecified: Secondary | ICD-10-CM | POA: Diagnosis not present

## 2017-10-24 MED ORDER — MUPIROCIN 2 % EX OINT: 1.0000 "application " | TOPICAL_OINTMENT | Freq: Two times a day (BID) | CUTANEOUS | 0 refills | Status: DC

## 2017-10-24 NOTE — Patient Instructions (Addendum)
Anal Fissure, Adult An anal fissure is a small tear or crack in the skin around the anus. Bleeding from a fissure usually stops on its own within a few minutes. However, bleeding will often occur again with each bowel movement until the crack heals. What are the causes? This condition may be caused by:  Passing large, hard stool (feces).  Frequent diarrhea.  Constipation.  Inflammatory bowel disease (Crohn disease or ulcerative colitis).  Infections.  Anal sex.  What are the signs or symptoms? Symptoms of this condition include:  Bleeding from the rectum.  Small amounts of blood seen on your stool, on toilet paper, or in the toilet after a bowel movement.  Painful bowel movements.  Itching or irritation around the anus.  How is this diagnosed? A health care provider may diagnose this condition by closely examining the anal area. An anal fissure can usually be seen with careful inspection. In some cases, a rectal exam may be performed, or a short tube (anoscope) may be used to examine the anal canal. How is this treated? Treatment for this condition may include:  Taking steps to avoid constipation. This may include making changes to your diet, such as increasing your intake of fiber or fluid.  Taking fiber supplements. These supplements can soften your stool to help make bowel movements easier. Your health care provider may also prescribe a stool softener if your stool is often hard.  Taking sitz baths. This may help to heal the tear.  Using medicated creams or ointments. These may be prescribed to lessen discomfort.  Follow these instructions at home: Eating and drinking  Avoid foods that may be constipating, such as bananas and dairy products.  Drink enough fluid to keep your urine clear or pale yellow.  Maintain a diet that is high in fruits, whole grains, and vegetables. General instructions  Keep the anal area as clean and dry as possible.  Take sitz baths as  told by your health care provider. Do not use soap in the sitz baths.  Take over-the-counter and prescription medicines only as told by your health care provider.  Use creams or ointments only as told by your health care provider.  Keep all follow-up visits as told by your health care provider. This is important. Contact a health care provider if:  You have more bleeding.  You have a fever.  You have diarrhea that is mixed with blood.  You continue to have pain.  Your problem is getting worse rather than better. This information is not intended to replace advice given to you by your health care provider. Make sure you discuss any questions you have with your health care provider. Document Released: 08/30/2005 Document Revised: 01/07/2016 Document Reviewed: 11/25/2014 Elsevier Interactive Patient Education  2018 [employer]  

## 2017-10-24 NOTE — Progress Notes (Signed)
   MARGIE URBANOWICZ is a 52 y.o. female here for an acute visit.  History of Present Illness:   Lonell Grandchild, CMA acting as scribe for Dr. Briscoe Deutscher.   HPI: Rectal itching and pain intermittently. Wonders if she has hemorrhoids. IBS - diarrhea. No treatment.   PMHx, SurgHx, SocialHx, Medications, and Allergies were reviewed in the Visit Navigator and updated as appropriate.  Current Medications:   .  BIOTIN PO, Take by mouth., Disp: , Rfl:  .  cetirizine (ZYRTEC) 10 MG tablet, Take 10 mg by mouth daily as needed for allergies., Disp: , Rfl:  .  Cholecalciferol (VITAMIN D) 2000 UNITS CAPS, Take 1 tablet by mouth daily., Disp: , Rfl:  .  Multiple Vitamins-Minerals (MULTIVITAMIN WITH MINERALS) tablet, Take 1 tablet by mouth daily., Disp: , Rfl:  .  Omega-3 Fatty Acids (FISH OIL) 1000 MG CAPS, Take by mouth., Disp: , Rfl:  .  Probiotic Product (PROBIOTIC DAILY PO), Take by mouth., Disp: , Rfl:  .  TURMERIC PO, Take by mouth., Disp: , Rfl:    Allergies  Allergen Reactions  . Tetracyclines & Related Rash   Review of Systems:   Pertinent items are noted in the HPI. Otherwise, ROS is negative.  Vitals:   Vitals:   10/24/17 0824  BP: 130/76  Pulse: 96  Temp: 98.6 F (37 C)  TempSrc: Oral  SpO2: 96%  Weight: 269 lb 6.4 oz (122.2 kg)     Body mass index is 42.19 kg/m.  Physical Exam:   Physical Exam  Constitutional: She appears well-developed and well-nourished. No distress.  HENT:  Head: Normocephalic and atraumatic.  Eyes: EOM are normal. Pupils are equal, round, and reactive to light.  Neck: Normal range of motion. Neck supple.  Cardiovascular: Normal rate, regular rhythm, normal heart sounds and intact distal pulses.  Pulmonary/Chest: Effort normal.  Abdominal: Soft.  Genitourinary:  Genitourinary Comments: Small anal fissure.  Skin: Skin is warm.  Psychiatric: She has a normal mood and affect. Her behavior is normal.  Nursing note and vitals  reviewed.   Assessment and Plan:   1. Anal fissure - mupirocin ointment (BACTROBAN) 2 %; Place 1 application into the nose 2 (two) times daily.  Dispense: 22 g; Refill: 0  . Reviewed expectations re: course of current medical issues. . Discussed self-management of symptoms. . Outlined signs and symptoms indicating need for more acute intervention. . Patient verbalized understanding and all questions were answered. Marland Kitchen Health Maintenance issues including appropriate healthy diet, exercise, and smoking avoidance were discussed with patient. . See orders for this visit as documented in the electronic medical record. . Patient received an After Visit Summary.  CMA served as Education administrator during this visit. History, Physical, and Plan performed by medical provider. The above documentation has been reviewed and is accurate and complete. Briscoe Deutscher, D.O.  Briscoe Deutscher, DO [place] 10/24/2017

## 2018-03-22 ENCOUNTER — Encounter: Payer: Self-pay | Admitting: Family Medicine

## 2018-03-22 ENCOUNTER — Ambulatory Visit (INDEPENDENT_AMBULATORY_CARE_PROVIDER_SITE_OTHER): Payer: BLUE CROSS/BLUE SHIELD | Admitting: Family Medicine

## 2018-03-22 VITALS — BP 110/74 | HR 76 | Temp 98.4°F | Ht 67.0 in | Wt 262.0 lb

## 2018-03-22 DIAGNOSIS — L568 Other specified acute skin changes due to ultraviolet radiation: Secondary | ICD-10-CM | POA: Diagnosis not present

## 2018-03-22 MED ORDER — TRIAMCINOLONE 0.1 % CREAM:EUCERIN CREAM 1:1: 1.0000 "application " | TOPICAL_CREAM | Freq: Two times a day (BID) | CUTANEOUS | 4 refills | Status: DC

## 2018-03-22 MED ORDER — TRIAMCINOLONE ACETONIDE 0.025 % EX CREA: 1.0000 "application " | TOPICAL_CREAM | Freq: Two times a day (BID) | CUTANEOUS | 0 refills | Status: DC

## 2018-03-22 NOTE — Patient Instructions (Addendum)
Mix the Triamcinolone cream with Cerave and apply each day after your shower.

## 2018-03-22 NOTE — Progress Notes (Signed)
ARIYANAH AGUADO is a 52 y.o. female here for an acute visit.  History of Present Illness:   HPI: Rash on arms and chest, worst after being in the sun, intermittent for years. Itchy. No open lesions. Benadryl helps the itch.  PMHx, SurgHx, SocialHx, Medications, and Allergies were reviewed in the Visit Navigator and updated as appropriate.  Current Medications:   Current Outpatient Medications:  .  BIOTIN PO, Take by mouth., Disp: , Rfl:  .  cetirizine (ZYRTEC) 10 MG tablet, Take 10 mg by mouth daily as needed for allergies., Disp: , Rfl:  .  Cholecalciferol (VITAMIN D) 2000 UNITS CAPS, Take 1 tablet by mouth daily., Disp: , Rfl:  .  Multiple Vitamins-Minerals (MULTIVITAMIN WITH MINERALS) tablet, Take 1 tablet by mouth daily., Disp: , Rfl:  .  mupirocin ointment (BACTROBAN) 2 %, Place 1 application into the nose 2 (two) times daily., Disp: 22 g, Rfl: 0 .  Omega-3 Fatty Acids (FISH OIL) 1000 MG CAPS, Take by mouth., Disp: , Rfl:  .  Probiotic Product (PROBIOTIC DAILY PO), Take by mouth., Disp: , Rfl:  .  tretinoin (RETIN-A) 0.025 % cream, APPLY PEA SIZED AMOUNT TO FACE AT BEDTIME, Disp: , Rfl: 2 .  TURMERIC PO, Take by mouth., Disp: , Rfl:  .  triamcinolone (KENALOG) 0.025 % cream, Apply 1 application topically 2 (two) times daily., Disp: 454 g, Rfl: 0   Allergies  Allergen Reactions  . Tetracyclines & Related Rash   Review of Systems:   Pertinent items are noted in the HPI. Otherwise, ROS is negative.  Vitals:   Vitals:   03/22/18 1256  BP: 110/74  Pulse: 76  Temp: 98.4 F (36.9 C)  TempSrc: Oral  SpO2: 99%  Weight: 262 lb (118.8 kg)  Height: 5\' 7"  (1.702 m)     Body mass index is 41.04 kg/m.  Physical Exam:   Physical Exam  Constitutional: She appears well-nourished.  HENT:  Head: Normocephalic and atraumatic.  Eyes: Pupils are equal, round, and reactive to light. EOM are normal.  Neck: Normal range of motion. Neck supple.  Cardiovascular: Normal rate,  regular rhythm, normal heart sounds and intact distal pulses.  Pulmonary/Chest: Effort normal.  Abdominal: Soft.  Skin: Skin is warm.  Diffuse erythema and raised irritated skin on forearms and elbows.  Psychiatric: She has a normal mood and affect. Her behavior is normal.  Nursing note and vitals reviewed.    Results for orders placed or performed in visit on 05/31/17  Cytology - PAP  Result Value Ref Range   Adequacy      Satisfactory for evaluation  endocervical/transformation zone component PRESENT.   Diagnosis      NEGATIVE FOR INTRAEPITHELIAL LESIONS OR MALIGNANCY. BENIGN REACTIVE/REPARATIVE CHANGES.   HPV NOT DETECTED    Material Submitted CervicoVaginal Pap [ThinPrep Imaged]    CYTOLOGY - PAP PAP RESULT     Assessment and Plan:   Allisen was seen today for rash.  Diagnoses and all orders for this visit:  Photosensitivity dermatitis  Other orders -     Discontinue: Triamcinolone Acetonide (TRIAMCINOLONE 0.1 % CREAM : EUCERIN) CREA; Apply 1 application topically 2 (two) times daily. -     triamcinolone (KENALOG) 0.025 % cream; Apply 1 application topically 2 (two) times daily.    . Reviewed expectations re: course of current medical issues. . Discussed self-management of symptoms. . Outlined signs and symptoms indicating need for more acute intervention. . Patient verbalized understanding and all questions were answered. Marland Kitchen  Health Maintenance issues including appropriate healthy diet, exercise, and smoking avoidance were discussed with patient. . See orders for this visit as documented in the electronic medical record. . Patient received an After Visit Summary.   Briscoe Deutscher, DO [place] 03/22/2018

## 2018-05-08 ENCOUNTER — Encounter: Payer: BLUE CROSS/BLUE SHIELD | Admitting: Family Medicine

## 2018-05-28 NOTE — Progress Notes (Signed)
Subjective:    MADDYX VALLIE is a 52 y.o. female and is here for a comprehensive physical exam.  Health Maintenance Due  Topic Date Due  . HIV Screening  10/05/1982  . TETANUS/TDAP  10/05/1986  . COLONOSCOPY  10/05/2017   Current Outpatient Medications:  .  BIOTIN PO, Take by mouth., Disp: , Rfl:  .  cetirizine (ZYRTEC) 10 MG tablet, Take 10 mg by mouth daily as needed for allergies., Disp: , Rfl:  .  Cholecalciferol (VITAMIN D) 2000 UNITS CAPS, Take 1 tablet by mouth daily., Disp: , Rfl:  .  Multiple Vitamins-Minerals (MULTIVITAMIN WITH MINERALS) tablet, Take 1 tablet by mouth daily., Disp: , Rfl:  .  mupirocin ointment (BACTROBAN) 2 %, Place 1 application into the nose 2 (two) times daily., Disp: 22 g .  Omega-3 Fatty Acids (FISH OIL) 1000 MG CAPS, Take by mouth., Disp: , Rfl:  .  Probiotic Product (PROBIOTIC DAILY PO), Take by mouth., Disp: , Rfl:  .  tretinoin (RETIN-A) 0.025 % cream, APPLY PEA SIZED AMOUNT TO FACE AT BEDTIME, Disp: , Rfl: 2 .  triamcinolone (KENALOG) 0.025 % cream, Apply 1 application topically 2 (two) times daily., Disp: 454 g, Rfl: 0 .  TURMERIC PO, Take by mouth., Disp: , Rfl:   PMHx, SurgHx, SocialHx, Medications, and Allergies were reviewed in the Visit Navigator and updated as appropriate.   Past Medical History:  Diagnosis Date  . Fibroid   . Hyperlipidemia 04/13/2017  . IBS (irritable bowel syndrome) 06/20/2009  . Menorrhagia 09/17/2010  . Morbid obesity ([place]) 06/20/2009  . Vitamin B 12 deficiency 04/13/2017     Past Surgical History:  Procedure Laterality Date  . ENDOMETRIAL ABLATION       Family History  Problem Relation Age of Onset  . Hyperlipidemia Mother   . Osteoporosis Mother   . Diabetes Father   . Mental illness Father   . Dementia Maternal Grandmother    Social History   Tobacco Use  . Smoking status: Never Smoker  . Smokeless tobacco: Never Used  Substance Use Topics  . Alcohol use: Yes    Alcohol/week: 1.0 standard  drinks    Types: 1 Glasses of wine per week  . Drug use: No   Review of Systems:   Pertinent items are noted in the HPI. Otherwise, ROS is negative.  Objective:   BP 110/76   Pulse 60   Temp 98.1 F (36.7 C) (Oral)   Ht 5\' 7"  (1.702 m)   Wt 266 lb (120.7 kg)   SpO2 98%   BMI 41.66 kg/m   General appearance: alert, cooperative and appears stated age. Head: normocephalic, without obvious abnormality, atraumatic. Neck: no adenopathy, supple, symmetrical, trachea midline; thyroid not enlarged, symmetric, no tenderness/mass/nodules. Lungs: clear to auscultation bilaterally. Heart: regular rate and rhythm Abdomen: soft, non-tender; no masses,  no organomegaly. Extremities: extremities normal, atraumatic, no cyanosis or edema. Skin: skin color, texture, turgor normal, no rashes or lesions. Lymph: cervical, supraclavicular, and axillary nodes normal; no abnormal inguinal nodes palpated. Neurologic: grossly normal.  Assessment/Plan:   Diagnoses and all orders for this visit:  Routine physical examination  Vitamin B 12 deficiency -     CBC with Differential/Platelet -     Vitamin B12  Pure hypercholesterolemia -     Comprehensive metabolic panel -     Lipid panel  Morbid obesity (HCC)  Screening for HIV (human immunodeficiency virus) -     HIV Antibody (routine testing w rflx)  Screening for colon cancer -     Ambulatory referral to Gastroenterology   Patient Counseling: [x]    Nutrition: Stressed importance of moderation in sodium/caffeine intake, saturated fat and cholesterol, caloric balance, sufficient intake of fresh fruits, vegetables, fiber, calcium, iron, and 1 mg of folate supplement per day (for females capable of pregnancy).  [x]    Stressed the importance of regular exercise.   [x]    Substance Abuse: Discussed cessation/primary prevention of tobacco, alcohol, or other drug use; driving or other dangerous activities under the influence; availability of  treatment for abuse.   [x]    Injury prevention: Discussed safety belts, safety helmets, smoke detector, smoking near bedding or upholstery.   [x]    Sexuality: Discussed sexually transmitted diseases, partner selection, use of condoms, avoidance of unintended pregnancy  and contraceptive alternatives.  [x]    Dental health: Discussed importance of regular tooth brushing, flossing, and dental visits.  [x]    Health maintenance and immunizations reviewed. Please refer to Health maintenance section.   Briscoe Deutscher, DO [place]

## 2018-05-29 ENCOUNTER — Ambulatory Visit (INDEPENDENT_AMBULATORY_CARE_PROVIDER_SITE_OTHER): Payer: BLUE CROSS/BLUE SHIELD | Admitting: Family Medicine

## 2018-05-29 ENCOUNTER — Encounter: Payer: Self-pay | Admitting: Family Medicine

## 2018-05-29 VITALS — BP 110/76 | HR 60 | Temp 98.1°F | Ht 67.0 in | Wt 266.0 lb

## 2018-05-29 DIAGNOSIS — Z1211 Encounter for screening for malignant neoplasm of colon: Secondary | ICD-10-CM

## 2018-05-29 DIAGNOSIS — Z0000 Encounter for general adult medical examination without abnormal findings: Secondary | ICD-10-CM

## 2018-05-29 DIAGNOSIS — Z114 Encounter for screening for human immunodeficiency virus [HIV]: Secondary | ICD-10-CM | POA: Diagnosis not present

## 2018-05-29 DIAGNOSIS — E538 Deficiency of other specified B group vitamins: Secondary | ICD-10-CM | POA: Diagnosis not present

## 2018-05-29 DIAGNOSIS — E7800 Pure hypercholesterolemia, unspecified: Secondary | ICD-10-CM | POA: Diagnosis not present

## 2018-05-29 LAB — LIPID PANEL
Cholesterol: 214 mg/dL — ABNORMAL HIGH (ref 0–200)
HDL: 50.6 mg/dL (ref >39.00)
LDL Cholesterol: 145 mg/dL — ABNORMAL HIGH (ref 0–99)
NonHDL: 163.31
Total CHOL/HDL Ratio: 4
Triglycerides: 94 mg/dL (ref 0.0–149.0)
VLDL: 18.8 mg/dL (ref 0.0–40.0)

## 2018-05-29 LAB — CBC WITH DIFFERENTIAL/PLATELET
Basophils Absolute: 0.1 10*3/uL (ref 0.0–0.1)
Basophils Relative: 0.7 % (ref 0.0–3.0)
Eosinophils Absolute: 0.1 10*3/uL (ref 0.0–0.7)
Eosinophils Relative: 1.1 % (ref 0.0–5.0)
HCT: 40.7 % (ref 36.0–46.0)
Hemoglobin: 13.9 g/dL (ref 12.0–15.0)
Lymphocytes Relative: 32.9 % (ref 12.0–46.0)
Lymphs Abs: 2.7 10*3/uL (ref 0.7–4.0)
MCHC: 34 g/dL (ref 30.0–36.0)
MCV: 89.9 fl (ref 78.0–100.0)
Monocytes Absolute: 0.6 10*3/uL (ref 0.1–1.0)
Monocytes Relative: 7.5 % (ref 3.0–12.0)
Neutro Abs: 4.8 10*3/uL (ref 1.4–7.7)
Neutrophils Relative %: 57.8 % (ref 43.0–77.0)
Platelets: 329 10*3/uL (ref 150.0–400.0)
RBC: 4.53 Mil/uL (ref 3.87–5.11)
RDW: 12.6 % (ref 11.5–15.5)
WBC: 8.4 10*3/uL (ref 4.0–10.5)

## 2018-05-29 LAB — COMPREHENSIVE METABOLIC PANEL
ALT: 16 U/L (ref 0–35)
AST: 12 U/L (ref 0–37)
Albumin: 4.4 g/dL (ref 3.5–5.2)
Alkaline Phosphatase: 96 U/L (ref 39–117)
BUN: 14 mg/dL (ref 6–23)
CO2: 33 mEq/L — ABNORMAL HIGH (ref 19–32)
Calcium: 9.7 mg/dL (ref 8.4–10.5)
Chloride: 100 mEq/L (ref 96–112)
Creatinine, Ser: 0.86 mg/dL (ref 0.40–1.20)
GFR: 74.04 mL/min (ref >60.00)
Glucose, Bld: 82 mg/dL (ref 70–99)
Potassium: 4.6 mEq/L (ref 3.5–5.1)
Sodium: 138 mEq/L (ref 135–145)
Total Bilirubin: 0.6 mg/dL (ref 0.2–1.2)
Total Protein: 7.4 g/dL (ref 6.0–8.3)

## 2018-05-29 LAB — VITAMIN B12: Vitamin B-12: 292 pg/mL (ref 211–911)

## 2018-05-30 LAB — HIV ANTIBODY (ROUTINE TESTING W REFLEX): HIV 1&2 Ab, 4th Generation: NONREACTIVE

## 2018-06-02 ENCOUNTER — Encounter: Payer: Self-pay | Admitting: Family Medicine

## 2018-06-05 NOTE — Progress Notes (Signed)
52 y.o. [id number] Married White or Caucasian Not Hispanic or Latino female here for annual exam.  H/O endometrial ablation over 10 years ago. No cycles since then. Vasomotor symptoms come and go. Currently better. Sexually active, no pain.     No LMP recorded. Patient has had an ablation.          Sexually active: Yes.    The current method of family planning is none.    Exercising: No.  The patient does not participate in regular exercise at present. Smoker:  no  Health Maintenance: Pap:  06/01/2017 normal, negative hpv.  History of abnormal Pap:  no MMG:  07/28/2017 BI-RADS CATEGORY  1: Negative. BMD:   n/a Colonoscopy: n/a TDaP:  Unsure, she will do with her primary.  Gardasil: never   reports that she has never smoked. She has never used smokeless tobacco. She reports that she drinks about 1.0 standard drinks of alcohol per week. She reports that she does not use drugs. Homemaker, son is 68, daughter is 34.  Past Medical History:  Diagnosis Date  . Fibroid   . Hyperlipidemia 04/13/2017  . IBS (irritable bowel syndrome) 06/20/2009  . Menorrhagia 09/17/2010  . Morbid obesity ([place]) 06/20/2009  . Vitamin B 12 deficiency 04/13/2017    Past Surgical History:  Procedure Laterality Date  . ENDOMETRIAL ABLATION      Current Outpatient Medications  Medication Sig Dispense Refill  . BIOTIN PO Take by mouth.    . cetirizine (ZYRTEC) 10 MG tablet Take 10 mg by mouth daily as needed for allergies.    . Cholecalciferol (VITAMIN D) 2000 UNITS CAPS Take 1 tablet by mouth daily.    . Multiple Vitamins-Minerals (MULTIVITAMIN WITH MINERALS) tablet Take 1 tablet by mouth daily.    . mupirocin ointment (BACTROBAN) 2 % Place 1 application into the nose 2 (two) times daily. 22 g 0  . Omega-3 Fatty Acids (FISH OIL) 1000 MG CAPS Take by mouth.    . Probiotic Product (PROBIOTIC DAILY PO) Take by mouth.    . tretinoin (RETIN-A) 0.025 % cream APPLY PEA SIZED AMOUNT TO FACE AT BEDTIME  2  . triamcinolone  (KENALOG) 0.025 % cream Apply 1 application topically 2 (two) times daily. 454 g 0  . TURMERIC PO Take by mouth.     No current facility-administered medications for this visit.     Family History  Problem Relation Age of Onset  . Hyperlipidemia Mother   . Osteoporosis Mother   . Diabetes Father   . Mental illness Father   . Dementia Maternal Grandmother     Review of Systems  Constitutional: Negative.   HENT: Negative.   Eyes: Negative.   Respiratory: Negative.   Cardiovascular: Negative.   Gastrointestinal: Negative.   Endocrine: Negative.   Genitourinary: Negative.   Musculoskeletal: Negative.   Skin: Negative.   Allergic/Immunologic: Negative.   Neurological: Negative.   Hematological: Negative.   Psychiatric/Behavioral: Negative.   All other systems reviewed and are negative.   Exam:   BP 130/80   Pulse 71   Resp 16   Ht 5' 6.75" (1.695 m)   Wt 267 lb (121.1 kg)   BMI 42.13 kg/m   Weight change: @WEIGHTCHANGE @ Height:   Height: 5' 6.75" (169.5 cm)  Ht Readings from Last 3 Encounters:  06/07/18 5' 6.75" (1.695 m)  05/29/18 5\' 7"  (1.702 m)  03/22/18 5\' 7"  (1.702 m)    General appearance: alert, cooperative and appears stated age Head: Normocephalic, without obvious  abnormality, atraumatic Neck: no adenopathy, supple, symmetrical, trachea midline and thyroid normal to inspection and palpation Lungs: clear to auscultation bilaterally Cardiovascular: regular rate and rhythm Breasts: normal appearance, no masses or tenderness Abdomen: soft, non-tender; non distended,  no masses,  no organomegaly Extremities: extremities normal, atraumatic, no cyanosis or edema Skin: Skin color, texture, turgor normal. No rashes or lesions Lymph nodes: Cervical, supraclavicular, and axillary nodes normal. No abnormal inguinal nodes palpated Neurologic: Grossly normal   Pelvic: External genitalia:  no lesions              Urethra:  normal appearing urethra with no masses,  tenderness or lesions              Bartholins and Skenes: normal                 Vagina: normal appearing vagina with normal color and discharge, no lesions              Cervix: no lesions               Bimanual Exam:  Uterus:  normal size, contour, position, consistency, mobility, non-tender              Adnexa: no mass, fullness, tenderness               Rectovaginal: Confirms               Anus:  normal sphincter tone, no lesions  Chaperone was present for exam.  A:  Well Woman with normal exam  P:   No pap this year  Colonoscopy being set up by her primary  Mammogram in 11/19  Labs with primary  Discussed breast self exam  Discussed calcium and vit D intake

## 2018-06-07 ENCOUNTER — Ambulatory Visit (INDEPENDENT_AMBULATORY_CARE_PROVIDER_SITE_OTHER): Payer: BLUE CROSS/BLUE SHIELD | Admitting: Obstetrics and Gynecology

## 2018-06-07 ENCOUNTER — Encounter: Payer: Self-pay | Admitting: Obstetrics and Gynecology

## 2018-06-07 ENCOUNTER — Other Ambulatory Visit: Payer: Self-pay

## 2018-06-07 VITALS — BP 130/80 | HR 71 | Resp 16 | Ht 66.75 in | Wt 267.0 lb

## 2018-06-07 DIAGNOSIS — Z01419 Encounter for gynecological examination (general) (routine) without abnormal findings: Secondary | ICD-10-CM | POA: Diagnosis not present

## 2018-06-07 NOTE — Patient Instructions (Signed)
EXERCISE AND DIET:  We recommended that you start or continue a regular exercise program for good health. Regular exercise means any activity that makes your heart beat faster and makes you sweat.  We recommend exercising at least 30 minutes per day at least 3 days a week, preferably 4 or 5.  We also recommend a diet low in fat and sugar.  Inactivity, poor dietary choices and obesity can cause diabetes, heart attack, stroke, and kidney damage, among others.    ALCOHOL AND SMOKING:  Women should limit their alcohol intake to no more than 7 drinks/beers/glasses of wine (combined, not each!) per week. Moderation of alcohol intake to this level decreases your risk of breast cancer and liver damage. And of course, no recreational drugs are part of a healthy lifestyle.  And absolutely no smoking or even second hand smoke. Most people know smoking can cause heart and lung diseases, but did you know it also contributes to weakening of your bones? Aging of your skin?  Yellowing of your teeth and nails?  CALCIUM AND VITAMIN D:  Adequate intake of calcium and Vitamin D are recommended.  The recommendations for exact amounts of these supplements seem to change often, but generally speaking 600 mg of calcium (either carbonate or citrate) and 800 units of Vitamin D per day seems prudent. Certain women may benefit from higher intake of Vitamin D.  If you are among these women, your doctor will have told you during your visit.    PAP SMEARS:  Pap smears, to check for cervical cancer or precancers,  have traditionally been done yearly, although recent scientific advances have shown that most women can have pap smears less often.  However, every woman still should have a physical exam from her gynecologist every year. It will include a breast check, inspection of the vulva and vagina to check for abnormal growths or skin changes, a visual exam of the cervix, and then an exam to evaluate the size and shape of the uterus and  ovaries.  And after 50 years of age, a rectal exam is indicated to check for rectal cancers. We will also provide age appropriate advice regarding health maintenance, like when you should have certain vaccines, screening for sexually transmitted diseases, bone density testing, colonoscopy, mammograms, etc.   MAMMOGRAMS:  All women over 40 years old should have a yearly mammogram. Many facilities now offer a "3D" mammogram, which may cost around $50 extra out of pocket. If possible,  we recommend you accept the option to have the 3D mammogram performed.  It both reduces the number of women who will be called back for extra views which then turn out to be normal, and it is better than the routine mammogram at detecting truly abnormal areas.    COLONOSCOPY:  Colonoscopy to screen for colon cancer is recommended for all women at age 50.  We know, you hate the idea of the prep.  We agree, BUT, having colon cancer and not knowing it is worse!!  Colon cancer so often starts as a polyp that can be seen and removed at colonscopy, which can quite literally save your life!  And if your first colonoscopy is normal and you have no family history of colon cancer, most women don't have to have it again for 10 years.  Once every ten years, you can do something that may end up saving your life, right?  We will be happy to help you get it scheduled when you are ready.    Be sure to check your insurance coverage so you understand how much it will cost.  It may be covered as a preventative service at no cost, but you should check your particular policy.      Breast Self-Awareness Breast self-awareness means being familiar with how your breasts look and feel. It involves checking your breasts regularly and reporting any changes to your health care provider. Practicing breast self-awareness is important. A change in your breasts can be a sign of a serious medical problem. Being familiar with how your breasts look and feel allows  you to find any problems early, when treatment is more likely to be successful. All women should practice breast self-awareness, including women who have had breast implants. How to do a breast self-exam One way to learn what is normal for your breasts and whether your breasts are changing is to do a breast self-exam. To do a breast self-exam: Look for Changes  1. Remove all the clothing above your waist. 2. Stand in front of a mirror in a room with good lighting. 3. Put your hands on your hips. 4. Push your hands firmly downward. 5. Compare your breasts in the mirror. Look for differences between them (asymmetry), such as: ? Differences in shape. ? Differences in size. ? Puckers, dips, and bumps in one breast and not the other. 6. Look at each breast for changes in your skin, such as: ? Redness. ? Scaly areas. 7. Look for changes in your nipples, such as: ? Discharge. ? Bleeding. ? Dimpling. ? Redness. ? A change in position. Feel for Changes  Carefully feel your breasts for lumps and changes. It is best to do this while lying on your back on the floor and again while sitting or standing in the shower or tub with soapy water on your skin. Feel each breast in the following way:  Place the arm on the side of the breast you are examining above your head.  Feel your breast with the other hand.  Start in the nipple area and make  inch (2 cm) overlapping circles to feel your breast. Use the pads of your three middle fingers to do this. Apply light pressure, then medium pressure, then firm pressure. The light pressure will allow you to feel the tissue closest to the skin. The medium pressure will allow you to feel the tissue that is a little deeper. The firm pressure will allow you to feel the tissue close to the ribs.  Continue the overlapping circles, moving downward over the breast until you feel your ribs below your breast.  Move one finger-width toward the center of the body.  Continue to use the  inch (2 cm) overlapping circles to feel your breast as you move slowly up toward your collarbone.  Continue the up and down exam using all three pressures until you reach your armpit.  Write Down What You Find  Write down what is normal for each breast and any changes that you find. Keep a written record with breast changes or normal findings for each breast. By writing this information down, you do not need to depend only on memory for size, tenderness, or location. Write down where you are in your menstrual cycle, if you are still menstruating. If you are having trouble noticing differences in your breasts, do not get discouraged. With time you will become more familiar with the variations in your breasts and more comfortable with the exam. How often should I examine my breasts? Examine   your breasts every month. If you are breastfeeding, the best time to examine your breasts is after a feeding or after using a breast pump. If you menstruate, the best time to examine your breasts is 5-7 days after your period is over. During your period, your breasts are lumpier, and it may be more difficult to notice changes. When should I see my health care provider? See your health care provider if you notice:  A change in shape or size of your breasts or nipples.  A change in the skin of your breast or nipples, such as a reddened or scaly area.  Unusual discharge from your nipples.  A lump or thick area that was not there before.  Pain in your breasts.  Anything that concerns you.  This information is not intended to replace advice given to you by your health care provider. Make sure you discuss any questions you have with your health care provider. Document Released: 08/30/2005 Document Revised: 02/05/2016 Document Reviewed: 07/20/2015 Elsevier Interactive Patient Education  2018 [employer]  

## 2018-06-13 ENCOUNTER — Encounter: Payer: Self-pay | Admitting: Family Medicine

## 2018-06-15 ENCOUNTER — Encounter: Payer: Self-pay | Admitting: Obstetrics and Gynecology

## 2018-06-16 ENCOUNTER — Telehealth: Payer: Self-pay | Admitting: Obstetrics and Gynecology

## 2018-06-16 MED ORDER — NYSTATIN 100000 UNIT/GM EX CREA: TOPICAL_CREAM | Freq: Two times a day (BID) | CUTANEOUS | 0 refills | Status: AC

## 2018-06-16 NOTE — Telephone Encounter (Signed)
Spoke with Dr. Talbert Nan and order received.  Rx sent to [facility] on file.  [facility]art message sent to patient.  Encounter closed.

## 2018-06-16 NOTE — Telephone Encounter (Signed)
Patient sent the following correspondence through [place]. Routing to triage to assist patient with request.  Hello. Dr. Talbert Nan mentioned writing a script for an ointment fora test infection on my abdomen. She said she was going to send it to [facility] on [address].    I stopped by there but they did not have anything for me. Can you check and see if a script was ordered?    Thanks,  Katrina Hernandez

## 2018-06-30 ENCOUNTER — Encounter: Payer: Self-pay | Admitting: Family Medicine

## 2018-07-28 DIAGNOSIS — D2272 Melanocytic nevi of left lower limb, including hip: Secondary | ICD-10-CM | POA: Diagnosis not present

## 2018-07-28 DIAGNOSIS — D2271 Melanocytic nevi of right lower limb, including hip: Secondary | ICD-10-CM | POA: Diagnosis not present

## 2018-07-28 DIAGNOSIS — D225 Melanocytic nevi of trunk: Secondary | ICD-10-CM | POA: Diagnosis not present

## 2018-07-28 DIAGNOSIS — L821 Other seborrheic keratosis: Secondary | ICD-10-CM | POA: Diagnosis not present

## 2018-07-30 IMAGING — MG DIGITAL SCREENING BILATERAL MAMMOGRAM WITH CAD
4 series · 4 of 4 positions shown · non-contrast
Comparison: None.

CLINICAL DATA: Screening.

EXAM:
DIGITAL SCREENING BILATERAL MAMMOGRAM WITH CAD

[R CC]
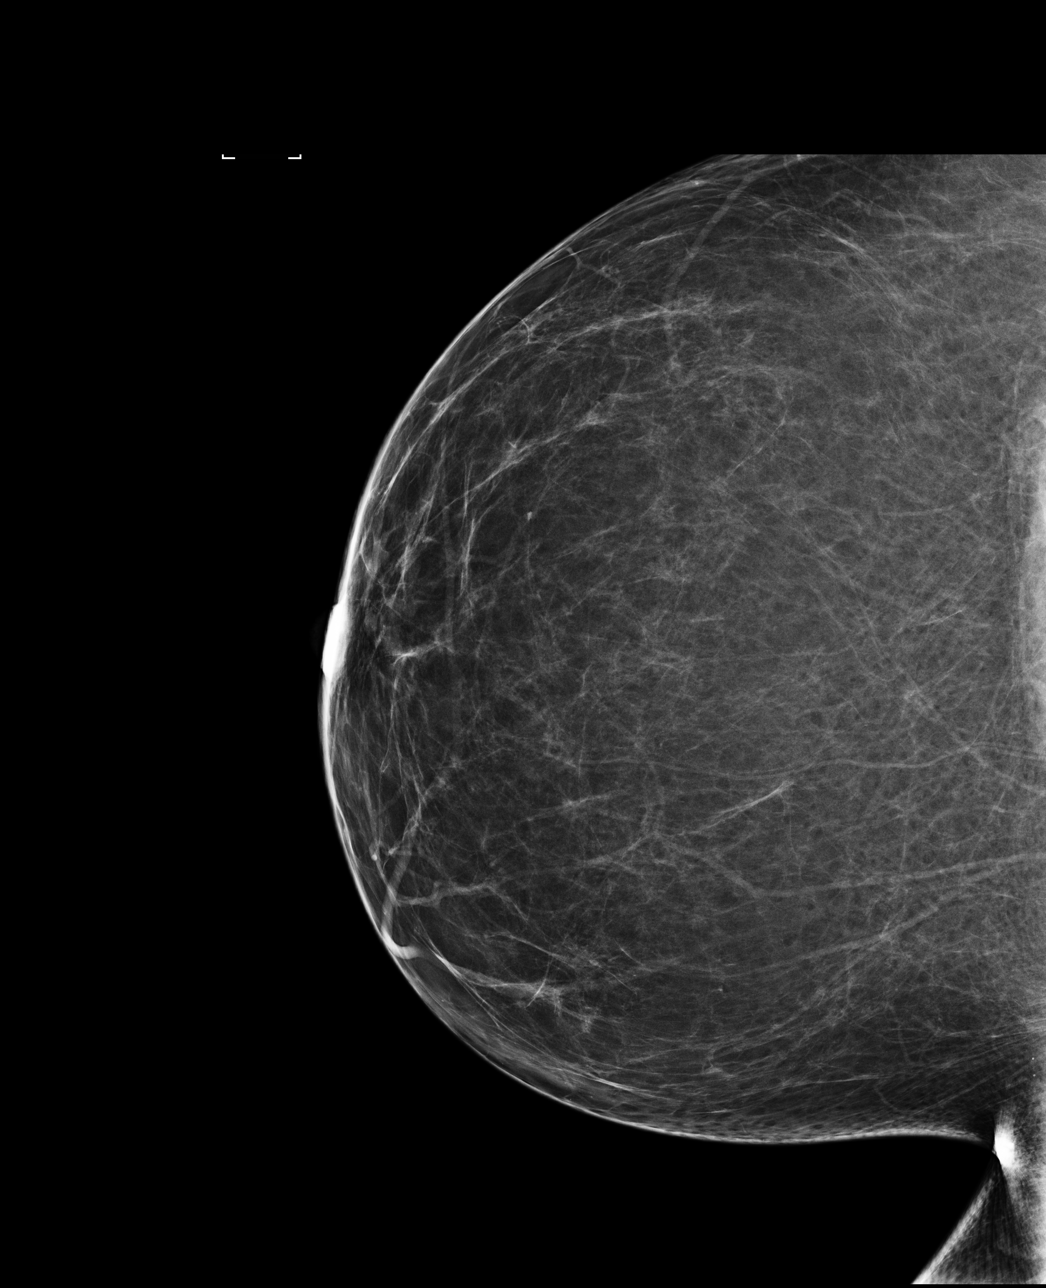

[L MLO]
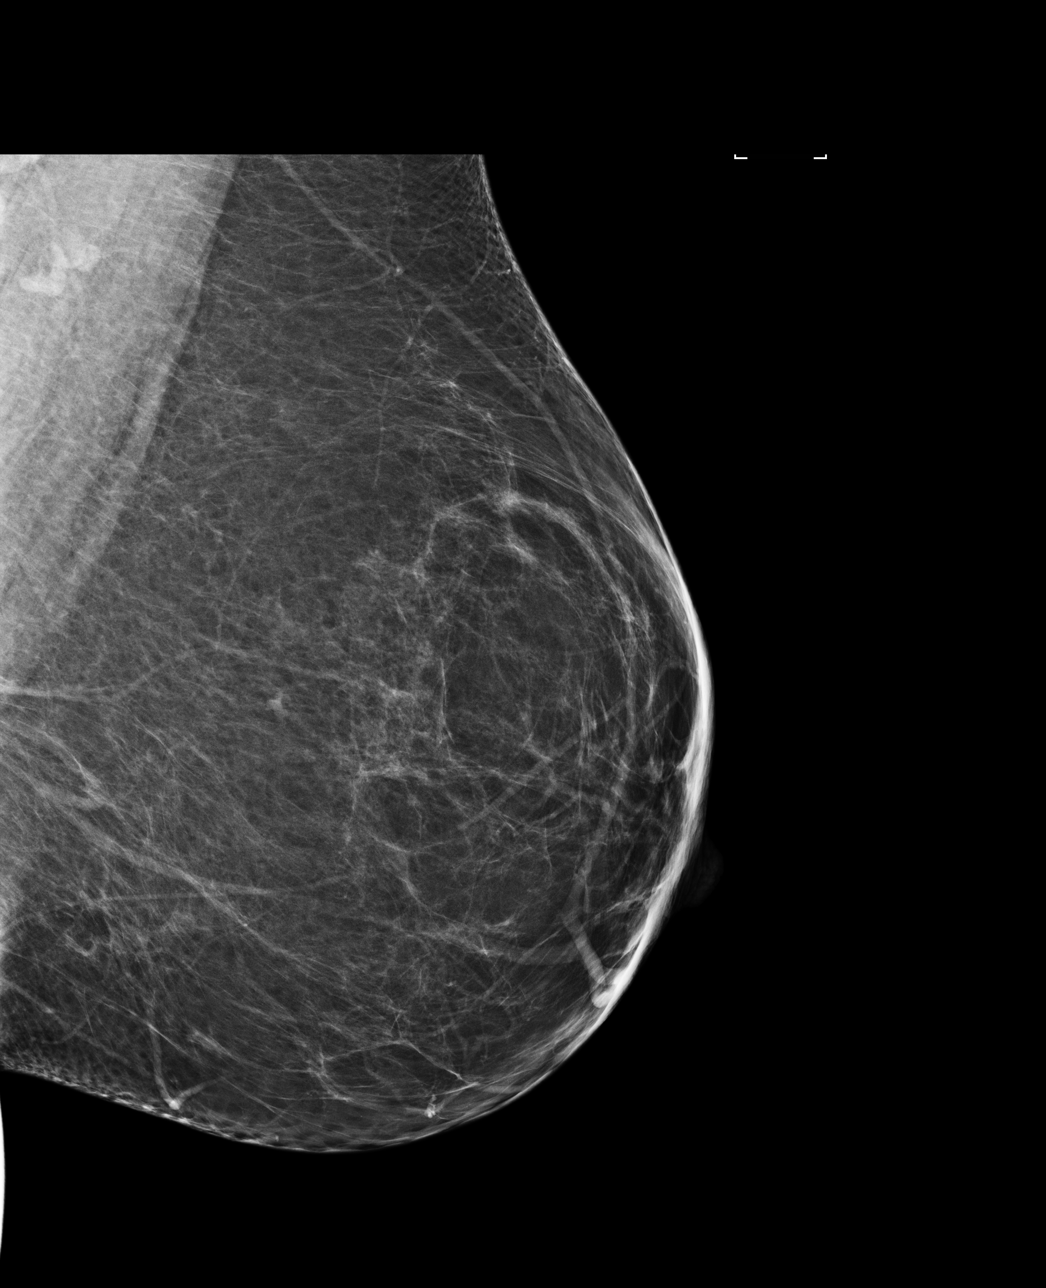

[R MLO]
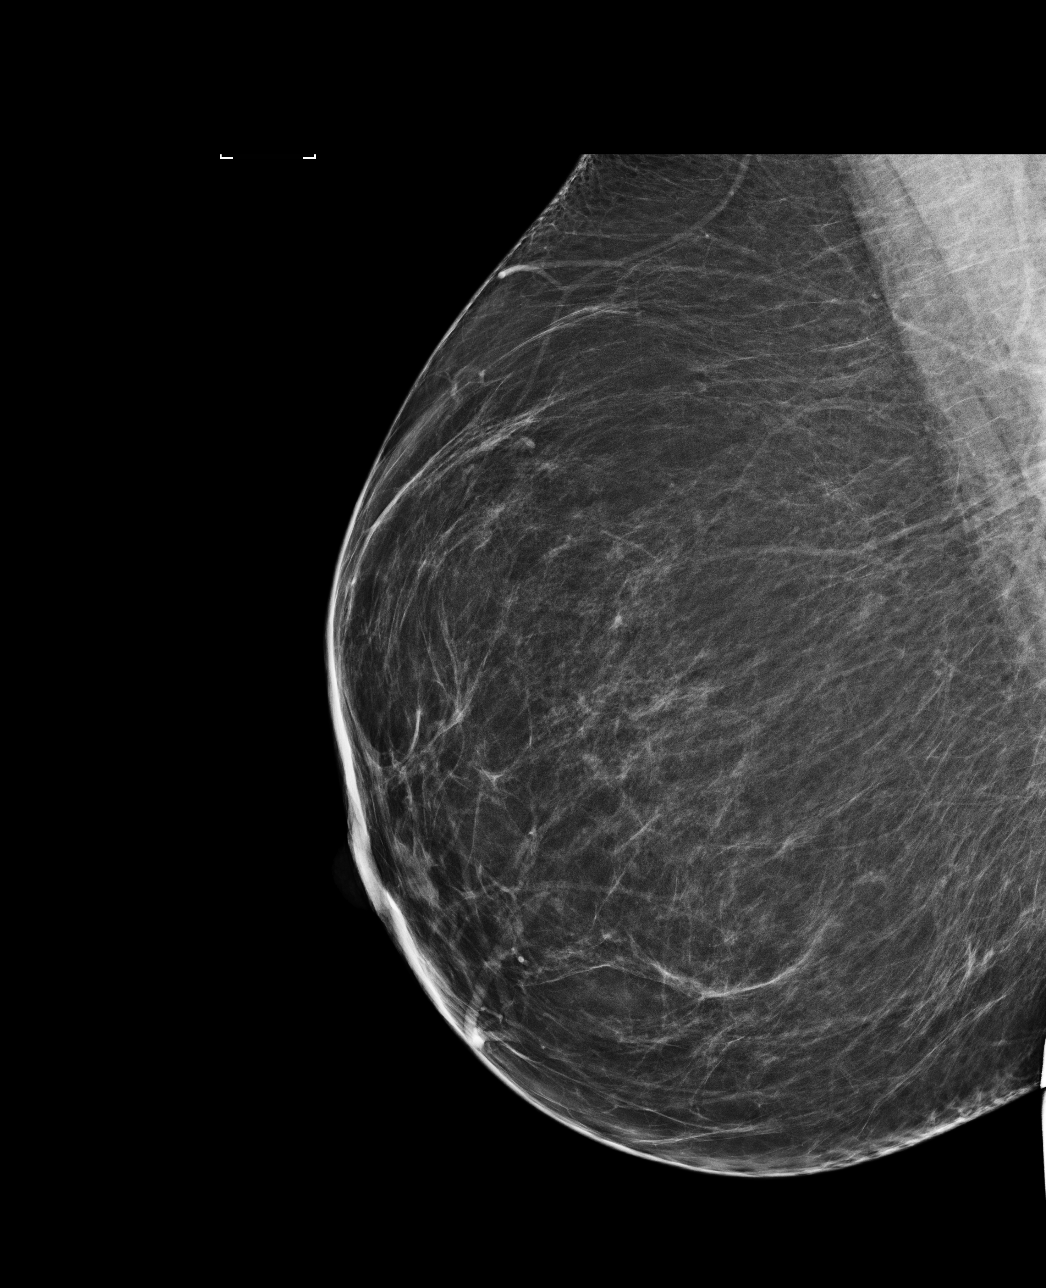

[L CC]
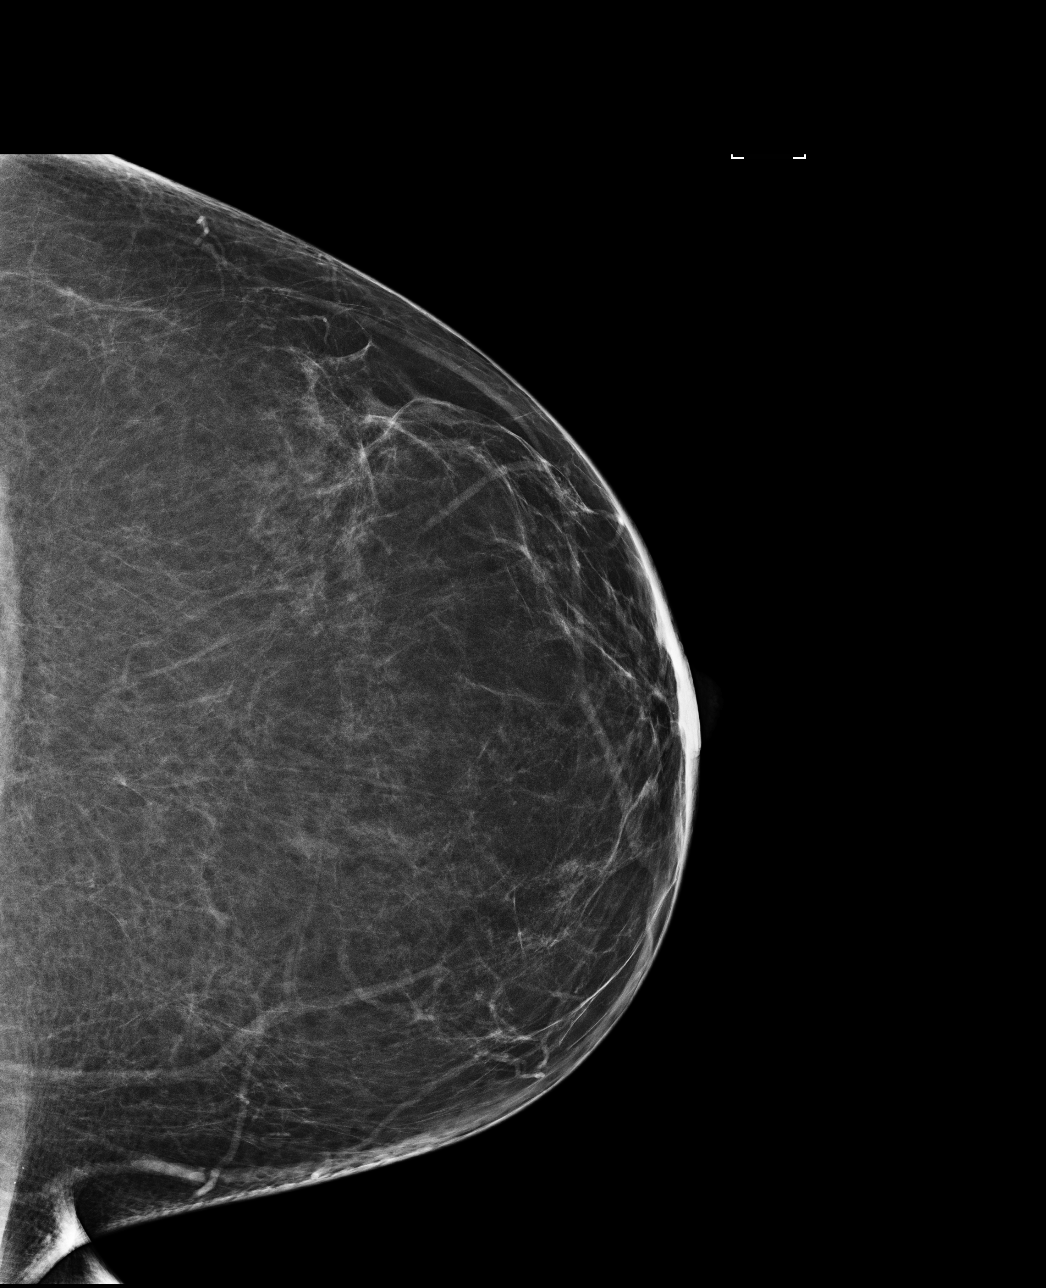

[4 of 4 positions shown; findings below may reference images not displayed]

ACR Breast Density Category b: There are scattered areas of
fibroglandular density.
FINDINGS: There are no findings suspicious for malignancy. Images were
processed with CAD.
IMPRESSION: No mammographic evidence of malignancy. A result letter of this
screening mammogram will be mailed directly to the patient.

RECOMMENDATION:
Screening mammogram in one year. (Code:[id number])

BI-RADS CATEGORY  1: Negative.

## 2018-09-14 DIAGNOSIS — K9041 Non-celiac gluten sensitivity: Secondary | ICD-10-CM | POA: Diagnosis not present

## 2018-09-14 DIAGNOSIS — R197 Diarrhea, unspecified: Secondary | ICD-10-CM | POA: Diagnosis not present

## 2018-09-14 DIAGNOSIS — Z1211 Encounter for screening for malignant neoplasm of colon: Secondary | ICD-10-CM | POA: Diagnosis not present

## 2018-09-25 ENCOUNTER — Ambulatory Visit: Payer: Self-pay

## 2018-09-25 MED ORDER — SCOPOLAMINE 1 MG/3DAYS TD PT72: 1.0000 | MEDICATED_PATCH | TRANSDERMAL | 0 refills | Status: DC

## 2018-09-25 NOTE — Telephone Encounter (Signed)
In [place].  Very susceptible to getting sea sick.  Is requesting a "Patch" to prevent her from getting sick.  Is scheduled to go on Snorkeling cruise tomorrow.  Asked is Dr. Juleen China would send in an Rx for her?  Advised will send message to office, to attention of Dr. Juleen China.  Pt. Agreed with plan.      Reason for Disposition . Caller requesting a NON-URGENT new prescription or refill and triager unable to refill per unit policy  Answer Assessment - Initial Assessment Questions 1. SYMPTOMS: "Do you have any symptoms?"     Pt. Is in [place], and is very susceptible to getting sea sick on a boat.  Is sched. To go on snorkeling cruise, and is asking for medication to treat this.   2. SEVERITY: If symptoms are present, ask "Are they mild, moderate or severe?"    Denies at present.  Protocols used: MEDICATION QUESTION CALL-A-AH  Message from Judyann Munson sent at 09/25/2018 11:49 AM EST   Patient is calling to state she is in [place] and she is requesting a patch, in regards to being sea sick. She is hoping to have the prescription sent to:  [facility] [address] [place] [address] Phone: [phone] Store number: [id number]  Best contact number is [phone]

## 2018-09-25 NOTE — Telephone Encounter (Signed)
Rx sent in to pt preferred pharmacy.

## 2018-09-25 NOTE — Addendum Note (Signed)
Addended by: Gwenyth Ober R on: 09/25/2018 03:16 PM   Modules accepted: Orders

## 2018-09-25 NOTE — Telephone Encounter (Signed)
See note

## 2018-09-25 NOTE — Telephone Encounter (Signed)
Needs scopolamine patch. Okay to call in.

## 2018-09-25 NOTE — Telephone Encounter (Signed)
Please advise 

## 2018-10-13 DIAGNOSIS — Z1211 Encounter for screening for malignant neoplasm of colon: Secondary | ICD-10-CM | POA: Diagnosis not present

## 2018-10-13 DIAGNOSIS — K6389 Other specified diseases of intestine: Secondary | ICD-10-CM | POA: Diagnosis not present

## 2018-10-13 DIAGNOSIS — R197 Diarrhea, unspecified: Secondary | ICD-10-CM | POA: Diagnosis not present

## 2019-03-26 DIAGNOSIS — H90A21 Sensorineural hearing loss, unilateral, right ear, with restricted hearing on the contralateral side: Secondary | ICD-10-CM | POA: Diagnosis not present

## 2019-07-16 ENCOUNTER — Other Ambulatory Visit: Payer: Self-pay

## 2019-07-16 ENCOUNTER — Ambulatory Visit (INDEPENDENT_AMBULATORY_CARE_PROVIDER_SITE_OTHER): Payer: BC Managed Care – PPO | Admitting: Obstetrics and Gynecology

## 2019-07-16 ENCOUNTER — Encounter: Payer: Self-pay | Admitting: Obstetrics and Gynecology

## 2019-07-16 VITALS — BP 130/74 | HR 67 | Temp 98.2°F | Ht 67.0 in | Wt 282.0 lb

## 2019-07-16 DIAGNOSIS — Z01419 Encounter for gynecological examination (general) (routine) without abnormal findings: Secondary | ICD-10-CM | POA: Diagnosis not present

## 2019-07-16 DIAGNOSIS — Z6841 Body Mass Index (BMI) 40.0 and over, adult: Secondary | ICD-10-CM | POA: Diagnosis not present

## 2019-07-16 NOTE — Progress Notes (Signed)
53 y.o. [id number] Married White or Caucasian Not Hispanic or Latino female here for annual exam. H/O endometrial ablation, no bleeding.   Vasomotor symptoms are controlled as long as she goes to the [employer]. No dyspareunia.     No LMP recorded. Patient has had an ablation.          Sexually active: Yes.    The current method of family planning is Ablation.    Exercising: Yes.    walking  Smoker:  no  Health Maintenance: Pap:    06/01/2017 normal, negative hpv History of abnormal Pap:  no MMG:  07/28/17 Density B Bi-rads 1 neg  BMD:   NA Colonoscopy: Normal in 2020, f/u in 10 years.  TDaP:  Unsure  Gardasil: No   reports that she has never smoked. She has never used smokeless tobacco. She reports current alcohol use of about 1.0 standard drinks of alcohol per week. She reports that she does not use drugs. 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  Past Medical History:  Diagnosis Date  . Fibroid   . Hyperlipidemia 04/13/2017  . IBS (irritable bowel syndrome) 06/20/2009  . Menorrhagia 09/17/2010  . Morbid obesity ([place]) 06/20/2009  . Vitamin B 12 deficiency 04/13/2017    Past Surgical History:  Procedure Laterality Date  . ENDOMETRIAL ABLATION      Current Outpatient Medications  Medication Sig Dispense Refill  . cetirizine (ZYRTEC) 10 MG tablet Take 10 mg by mouth daily as needed for allergies.    . Cholecalciferol (VITAMIN D) 2000 UNITS CAPS Take 1 tablet by mouth daily.    . Multiple Vitamins-Minerals (MULTIVITAMIN WITH MINERALS) tablet Take 1 tablet by mouth daily.    . Omega-3 Fatty Acids (FISH OIL) 1000 MG CAPS Take by mouth.    Marland Kitchen scopolamine (TRANSDERM-SCOP) 1 MG/3DAYS Place 1 patch (1.5 mg total) onto the skin every 3 (three) days. 10 patch 0  . tretinoin (RETIN-A) 0.025 % cream APPLY PEA SIZED AMOUNT TO FACE AT BEDTIME  2  . triamcinolone (KENALOG) 0.025 % cream Apply 1 application topically 2 (two) times daily. 454 g 0   No current facility-administered medications for  this visit.     Family History  Problem Relation Age of Onset  . Hyperlipidemia Mother   . Osteoporosis Mother   . Diabetes Father   . Mental illness Father   . Dementia Maternal Grandmother     Review of Systems  Musculoskeletal:       Occasional joint pain  All other systems reviewed and are negative.   Exam:   BP 130/74   Pulse 67   Temp 98.2 F (36.8 C)   Ht 5\' 7"  (1.702 m)   Wt 282 lb (127.9 kg)   SpO2 95%   BMI 44.17 kg/m   Weight change: @WEIGHTCHANGE @ Height:   Height: 5\' 7"  (170.2 cm)  Ht Readings from Last 3 Encounters:  07/16/19 5\' 7"  (1.702 m)  06/07/18 5' 6.75" (1.695 m)  05/29/18 5\' 7"  (1.702 m)    General appearance: alert, cooperative and appears stated age Head: Normocephalic, without obvious abnormality, atraumatic Neck: no adenopathy, supple, symmetrical, trachea midline and thyroid normal to inspection and palpation Lungs: clear to auscultation bilaterally Cardiovascular: regular rate and rhythm Breasts: normal appearance, no masses or tenderness Abdomen: soft, non-tender; non distended,  no masses,  no organomegaly Extremities: extremities normal, atraumatic, no cyanosis or edema Skin: Skin color, texture, turgor normal. No rashes or lesions Lymph nodes: Cervical, supraclavicular, and axillary nodes normal.  No abnormal inguinal nodes palpated Neurologic: Grossly normal   Pelvic: External genitalia:  no lesions              Urethra:  normal appearing urethra with no masses, tenderness or lesions              Bartholins and Skenes: normal                 Vagina: normal appearing vagina with normal color and discharge, no lesions              Cervix: no lesions               Bimanual Exam:  Uterus:  no masses or tenderness              Adnexa: no mass, fullness, tenderness               Rectovaginal: Confirms               Anus:  normal sphincter tone, no lesions  Chaperone was present for exam.  A:  Well Woman with normal exam  BMI  44  P:   No pap this year  Mammogram overdue  Colon cancer screening  Discussed breast self exam  Discussed calcium and vit D intake  Labs with primary.

## 2019-07-16 NOTE — Patient Instructions (Signed)
EXERCISE AND DIET:  We recommended that you start or continue a regular exercise program for good health. Regular exercise means any activity that makes your heart beat faster and makes you sweat.  We recommend exercising at least 30 minutes per day at least 3 days a week, preferably 4 or 5.  We also recommend a diet low in fat and sugar.  Inactivity, poor dietary choices and obesity can cause diabetes, heart attack, stroke, and kidney damage, among others.    ALCOHOL AND SMOKING:  Women should limit their alcohol intake to no more than 7 drinks/beers/glasses of wine (combined, not each!) per week. Moderation of alcohol intake to this level decreases your risk of breast cancer and liver damage. And of course, no recreational drugs are part of a healthy lifestyle.  And absolutely no smoking or even second hand smoke. Most people know smoking can cause heart and lung diseases, but did you know it also contributes to weakening of your bones? Aging of your skin?  Yellowing of your teeth and nails?  CALCIUM AND VITAMIN D:  Adequate intake of calcium and Vitamin D are recommended.  The recommendations for exact amounts of these supplements seem to change often, but generally speaking 1,200 mg of calcium (between diet and supplement) and 800 units of Vitamin D per day seems prudent. Certain women may benefit from higher intake of Vitamin D.  If you are among these women, your doctor will have told you during your visit.    PAP SMEARS:  Pap smears, to check for cervical cancer or precancers,  have traditionally been done yearly, although recent scientific advances have shown that most women can have pap smears less often.  However, every woman still should have a physical exam from her gynecologist every year. It will include a breast check, inspection of the vulva and vagina to check for abnormal growths or skin changes, a visual exam of the cervix, and then an exam to evaluate the size and shape of the uterus and  ovaries.  And after 51 years of age, a rectal exam is indicated to check for rectal cancers. We will also provide age appropriate advice regarding health maintenance, like when you should have certain vaccines, screening for sexually transmitted diseases, bone density testing, colonoscopy, mammograms, etc.   MAMMOGRAMS:  All women over 40 years old should have a yearly mammogram. Many facilities now offer a "3D" mammogram, which may cost around $50 extra out of pocket. If possible,  we recommend you accept the option to have the 3D mammogram performed.  It both reduces the number of women who will be called back for extra views which then turn out to be normal, and it is better than the routine mammogram at detecting truly abnormal areas.    COLON CANCER SCREENING: Now recommend starting at age 45. At this time colonoscopy is not covered for routine screening until 50. There are take home tests that can be done between 45-49.   COLONOSCOPY:  Colonoscopy to screen for colon cancer is recommended for all women at age 50.  We know, you hate the idea of the prep.  We agree, BUT, having colon cancer and not knowing it is worse!!  Colon cancer so often starts as a polyp that can be seen and removed at colonscopy, which can quite literally save your life!  And if your first colonoscopy is normal and you have no family history of colon cancer, most women don't have to have it again for   10 years.  Once every ten years, you can do something that may end up saving your life, right?  We will be happy to help you get it scheduled when you are ready.  Be sure to check your insurance coverage so you understand how much it will cost.  It may be covered as a preventative service at no cost, but you should check your particular policy.      Breast Self-Awareness Breast self-awareness means being familiar with how your breasts look and feel. It involves checking your breasts regularly and reporting any changes to your  health care provider. Practicing breast self-awareness is important. A change in your breasts can be a sign of a serious medical problem. Being familiar with how your breasts look and feel allows you to find any problems early, when treatment is more likely to be successful. All women should practice breast self-awareness, including women who have had breast implants. How to do a breast self-exam One way to learn what is normal for your breasts and whether your breasts are changing is to do a breast self-exam. To do a breast self-exam: Look for Changes  1. Remove all the clothing above your waist. 2. Stand in front of a mirror in a room with good lighting. 3. Put your hands on your hips. 4. Push your hands firmly downward. 5. Compare your breasts in the mirror. Look for differences between them (asymmetry), such as: ? Differences in shape. ? Differences in size. ? Puckers, dips, and bumps in one breast and not the other. 6. Look at each breast for changes in your skin, such as: ? Redness. ? Scaly areas. 7. Look for changes in your nipples, such as: ? Discharge. ? Bleeding. ? Dimpling. ? Redness. ? A change in position. Feel for Changes Carefully feel your breasts for lumps and changes. It is best to do this while lying on your back on the floor and again while sitting or standing in the shower or tub with soapy water on your skin. Feel each breast in the following way:  Place the arm on the side of the breast you are examining above your head.  Feel your breast with the other hand.  Start in the nipple area and make  inch (2 cm) overlapping circles to feel your breast. Use the pads of your three middle fingers to do this. Apply light pressure, then medium pressure, then firm pressure. The light pressure will allow you to feel the tissue closest to the skin. The medium pressure will allow you to feel the tissue that is a little deeper. The firm pressure will allow you to feel the tissue  close to the ribs.  Continue the overlapping circles, moving downward over the breast until you feel your ribs below your breast.  Move one finger-width toward the center of the body. Continue to use the  inch (2 cm) overlapping circles to feel your breast as you move slowly up toward your collarbone.  Continue the up and down exam using all three pressures until you reach your armpit.  Write Down What You Find  Write down what is normal for each breast and any changes that you find. Keep a written record with breast changes or normal findings for each breast. By writing this information down, you do not need to depend only on memory for size, tenderness, or location. Write down where you are in your menstrual cycle, if you are still menstruating. If you are having trouble noticing differences   in your breasts, do not get discouraged. With time you will become more familiar with the variations in your breasts and more comfortable with the exam. How often should I examine my breasts? Examine your breasts every month. If you are breastfeeding, the best time to examine your breasts is after a feeding or after using a breast pump. If you menstruate, the best time to examine your breasts is 5-7 days after your period is over. During your period, your breasts are lumpier, and it may be more difficult to notice changes. When should I see my health care provider? See your health care provider if you notice:  A change in shape or size of your breasts or nipples.  A change in the skin of your breast or nipples, such as a reddened or scaly area.  Unusual discharge from your nipples.  A lump or thick area that was not there before.  Pain in your breasts.  Anything that concerns you.  

## 2019-08-24 DIAGNOSIS — L821 Other seborrheic keratosis: Secondary | ICD-10-CM | POA: Diagnosis not present

## 2019-08-24 DIAGNOSIS — D2271 Melanocytic nevi of right lower limb, including hip: Secondary | ICD-10-CM | POA: Diagnosis not present

## 2019-08-24 DIAGNOSIS — D2272 Melanocytic nevi of left lower limb, including hip: Secondary | ICD-10-CM | POA: Diagnosis not present

## 2019-08-24 DIAGNOSIS — D225 Melanocytic nevi of trunk: Secondary | ICD-10-CM | POA: Diagnosis not present

## 2020-01-03 DIAGNOSIS — D1801 Hemangioma of skin and subcutaneous tissue: Secondary | ICD-10-CM | POA: Diagnosis not present

## 2020-01-03 DIAGNOSIS — L814 Other melanin hyperpigmentation: Secondary | ICD-10-CM | POA: Diagnosis not present

## 2020-01-17 ENCOUNTER — Other Ambulatory Visit: Payer: Self-pay | Admitting: Obstetrics and Gynecology

## 2020-01-17 DIAGNOSIS — Z1231 Encounter for screening mammogram for malignant neoplasm of breast: Secondary | ICD-10-CM

## 2020-01-18 ENCOUNTER — Other Ambulatory Visit: Payer: Self-pay

## 2020-01-18 ENCOUNTER — Ambulatory Visit
Admission: RE | Admit: 2020-01-18 | Discharge: 2020-01-18 | Disposition: A | Discharge status: Home / Self Care | Payer: BC Managed Care – PPO | Source: Ambulatory Visit | Attending: Obstetrics and Gynecology | Admitting: Obstetrics and Gynecology

## 2020-01-18 DIAGNOSIS — Z1231 Encounter for screening mammogram for malignant neoplasm of breast: Secondary | ICD-10-CM

## 2020-05-12 DIAGNOSIS — M25562 Pain in left knee: Secondary | ICD-10-CM | POA: Insufficient documentation

## 2020-05-12 DIAGNOSIS — M1712 Unilateral primary osteoarthritis, left knee: Secondary | ICD-10-CM | POA: Diagnosis not present

## 2020-05-14 DIAGNOSIS — M25562 Pain in left knee: Secondary | ICD-10-CM | POA: Diagnosis not present

## 2020-05-27 DIAGNOSIS — M9903 Segmental and somatic dysfunction of lumbar region: Secondary | ICD-10-CM | POA: Diagnosis not present

## 2020-05-27 DIAGNOSIS — S83412A Sprain of medial collateral ligament of left knee, initial encounter: Secondary | ICD-10-CM | POA: Diagnosis not present

## 2020-05-27 DIAGNOSIS — M47816 Spondylosis without myelopathy or radiculopathy, lumbar region: Secondary | ICD-10-CM | POA: Diagnosis not present

## 2020-05-27 DIAGNOSIS — M9906 Segmental and somatic dysfunction of lower extremity: Secondary | ICD-10-CM | POA: Diagnosis not present

## 2020-06-02 DIAGNOSIS — M9906 Segmental and somatic dysfunction of lower extremity: Secondary | ICD-10-CM | POA: Diagnosis not present

## 2020-06-02 DIAGNOSIS — M9903 Segmental and somatic dysfunction of lumbar region: Secondary | ICD-10-CM | POA: Diagnosis not present

## 2020-06-02 DIAGNOSIS — M47816 Spondylosis without myelopathy or radiculopathy, lumbar region: Secondary | ICD-10-CM | POA: Diagnosis not present

## 2020-06-02 DIAGNOSIS — S83412A Sprain of medial collateral ligament of left knee, initial encounter: Secondary | ICD-10-CM | POA: Diagnosis not present

## 2020-06-03 DIAGNOSIS — S83412A Sprain of medial collateral ligament of left knee, initial encounter: Secondary | ICD-10-CM | POA: Diagnosis not present

## 2020-06-03 DIAGNOSIS — M47816 Spondylosis without myelopathy or radiculopathy, lumbar region: Secondary | ICD-10-CM | POA: Diagnosis not present

## 2020-06-03 DIAGNOSIS — M9903 Segmental and somatic dysfunction of lumbar region: Secondary | ICD-10-CM | POA: Diagnosis not present

## 2020-06-03 DIAGNOSIS — M9906 Segmental and somatic dysfunction of lower extremity: Secondary | ICD-10-CM | POA: Diagnosis not present

## 2020-06-04 DIAGNOSIS — M9906 Segmental and somatic dysfunction of lower extremity: Secondary | ICD-10-CM | POA: Diagnosis not present

## 2020-06-04 DIAGNOSIS — M47816 Spondylosis without myelopathy or radiculopathy, lumbar region: Secondary | ICD-10-CM | POA: Diagnosis not present

## 2020-06-04 DIAGNOSIS — M9903 Segmental and somatic dysfunction of lumbar region: Secondary | ICD-10-CM | POA: Diagnosis not present

## 2020-06-04 DIAGNOSIS — S83412A Sprain of medial collateral ligament of left knee, initial encounter: Secondary | ICD-10-CM | POA: Diagnosis not present

## 2020-06-09 DIAGNOSIS — M47816 Spondylosis without myelopathy or radiculopathy, lumbar region: Secondary | ICD-10-CM | POA: Diagnosis not present

## 2020-06-09 DIAGNOSIS — M9906 Segmental and somatic dysfunction of lower extremity: Secondary | ICD-10-CM | POA: Diagnosis not present

## 2020-06-09 DIAGNOSIS — M9903 Segmental and somatic dysfunction of lumbar region: Secondary | ICD-10-CM | POA: Diagnosis not present

## 2020-06-09 DIAGNOSIS — S83412A Sprain of medial collateral ligament of left knee, initial encounter: Secondary | ICD-10-CM | POA: Diagnosis not present

## 2020-06-11 DIAGNOSIS — M9906 Segmental and somatic dysfunction of lower extremity: Secondary | ICD-10-CM | POA: Diagnosis not present

## 2020-06-11 DIAGNOSIS — M47816 Spondylosis without myelopathy or radiculopathy, lumbar region: Secondary | ICD-10-CM | POA: Diagnosis not present

## 2020-06-11 DIAGNOSIS — M9903 Segmental and somatic dysfunction of lumbar region: Secondary | ICD-10-CM | POA: Diagnosis not present

## 2020-06-11 DIAGNOSIS — S83412A Sprain of medial collateral ligament of left knee, initial encounter: Secondary | ICD-10-CM | POA: Diagnosis not present

## 2020-06-16 DIAGNOSIS — M9906 Segmental and somatic dysfunction of lower extremity: Secondary | ICD-10-CM | POA: Diagnosis not present

## 2020-06-16 DIAGNOSIS — M47816 Spondylosis without myelopathy or radiculopathy, lumbar region: Secondary | ICD-10-CM | POA: Diagnosis not present

## 2020-06-16 DIAGNOSIS — M9903 Segmental and somatic dysfunction of lumbar region: Secondary | ICD-10-CM | POA: Diagnosis not present

## 2020-06-16 DIAGNOSIS — S83412A Sprain of medial collateral ligament of left knee, initial encounter: Secondary | ICD-10-CM | POA: Diagnosis not present

## 2020-06-18 DIAGNOSIS — M9906 Segmental and somatic dysfunction of lower extremity: Secondary | ICD-10-CM | POA: Diagnosis not present

## 2020-06-18 DIAGNOSIS — S83412A Sprain of medial collateral ligament of left knee, initial encounter: Secondary | ICD-10-CM | POA: Diagnosis not present

## 2020-06-18 DIAGNOSIS — M9903 Segmental and somatic dysfunction of lumbar region: Secondary | ICD-10-CM | POA: Diagnosis not present

## 2020-06-18 DIAGNOSIS — M47816 Spondylosis without myelopathy or radiculopathy, lumbar region: Secondary | ICD-10-CM | POA: Diagnosis not present

## 2020-06-23 DIAGNOSIS — M47816 Spondylosis without myelopathy or radiculopathy, lumbar region: Secondary | ICD-10-CM | POA: Diagnosis not present

## 2020-06-23 DIAGNOSIS — M9906 Segmental and somatic dysfunction of lower extremity: Secondary | ICD-10-CM | POA: Diagnosis not present

## 2020-06-23 DIAGNOSIS — S83412A Sprain of medial collateral ligament of left knee, initial encounter: Secondary | ICD-10-CM | POA: Diagnosis not present

## 2020-06-23 DIAGNOSIS — M9903 Segmental and somatic dysfunction of lumbar region: Secondary | ICD-10-CM | POA: Diagnosis not present

## 2020-06-24 ENCOUNTER — Encounter: Payer: BC Managed Care – PPO | Admitting: Physician Assistant

## 2020-06-25 DIAGNOSIS — S83412A Sprain of medial collateral ligament of left knee, initial encounter: Secondary | ICD-10-CM | POA: Diagnosis not present

## 2020-06-25 DIAGNOSIS — M47816 Spondylosis without myelopathy or radiculopathy, lumbar region: Secondary | ICD-10-CM | POA: Diagnosis not present

## 2020-06-25 DIAGNOSIS — M9906 Segmental and somatic dysfunction of lower extremity: Secondary | ICD-10-CM | POA: Diagnosis not present

## 2020-06-25 DIAGNOSIS — M9903 Segmental and somatic dysfunction of lumbar region: Secondary | ICD-10-CM | POA: Diagnosis not present

## 2020-06-30 DIAGNOSIS — M9903 Segmental and somatic dysfunction of lumbar region: Secondary | ICD-10-CM | POA: Diagnosis not present

## 2020-06-30 DIAGNOSIS — M9906 Segmental and somatic dysfunction of lower extremity: Secondary | ICD-10-CM | POA: Diagnosis not present

## 2020-06-30 DIAGNOSIS — S83412A Sprain of medial collateral ligament of left knee, initial encounter: Secondary | ICD-10-CM | POA: Diagnosis not present

## 2020-06-30 DIAGNOSIS — M47816 Spondylosis without myelopathy or radiculopathy, lumbar region: Secondary | ICD-10-CM | POA: Diagnosis not present

## 2020-07-01 DIAGNOSIS — D513 Other dietary vitamin B12 deficiency anemia: Secondary | ICD-10-CM | POA: Diagnosis not present

## 2020-07-01 DIAGNOSIS — E8881 Metabolic syndrome: Secondary | ICD-10-CM | POA: Diagnosis not present

## 2020-07-01 DIAGNOSIS — E7211 Homocystinuria: Secondary | ICD-10-CM | POA: Diagnosis not present

## 2020-07-01 DIAGNOSIS — E079 Disorder of thyroid, unspecified: Secondary | ICD-10-CM | POA: Diagnosis not present

## 2020-07-02 DIAGNOSIS — S83412A Sprain of medial collateral ligament of left knee, initial encounter: Secondary | ICD-10-CM | POA: Diagnosis not present

## 2020-07-02 DIAGNOSIS — M47816 Spondylosis without myelopathy or radiculopathy, lumbar region: Secondary | ICD-10-CM | POA: Diagnosis not present

## 2020-07-02 DIAGNOSIS — M9903 Segmental and somatic dysfunction of lumbar region: Secondary | ICD-10-CM | POA: Diagnosis not present

## 2020-07-02 DIAGNOSIS — M9906 Segmental and somatic dysfunction of lower extremity: Secondary | ICD-10-CM | POA: Diagnosis not present

## 2020-07-07 DIAGNOSIS — S83412A Sprain of medial collateral ligament of left knee, initial encounter: Secondary | ICD-10-CM | POA: Diagnosis not present

## 2020-07-07 DIAGNOSIS — M9903 Segmental and somatic dysfunction of lumbar region: Secondary | ICD-10-CM | POA: Diagnosis not present

## 2020-07-07 DIAGNOSIS — M9906 Segmental and somatic dysfunction of lower extremity: Secondary | ICD-10-CM | POA: Diagnosis not present

## 2020-07-07 DIAGNOSIS — M47816 Spondylosis without myelopathy or radiculopathy, lumbar region: Secondary | ICD-10-CM | POA: Diagnosis not present

## 2020-07-15 NOTE — Progress Notes (Signed)
54 y.o. [id number] Married White or Caucasian Not Hispanic or Latino female here for annual exam.  H/O endometrial ablation. No vaginal bleeding. Vasomotor symptoms are better. No dyspareunia.     No LMP recorded. Patient has had an ablation.          Sexually active: Yes.    The current method of family planning is none.    Exercising: No.  The patient does not participate in regular exercise at present. Smoker:  no  Health Maintenance: Pap:  06/01/2017 normal, negative hpv History of abnormal Pap:  no MMG:  5/7/21density B Bi-rads 1 neg  BMD:   NA  Colonoscopy: Normal in 2020, f/u in 10 years TDaP:  Unsure, declines  Gardasil: none    reports that she has never smoked. She has never used smokeless tobacco. She reports current alcohol use of about 1.0 standard drink of alcohol per week. She reports that she does not use drugs. Homemaker, son is 25, daughter is 65. Son is at [employer], Engineer, production.   Past Medical History:  Diagnosis Date  . Fibroid   . Hyperlipidemia 04/13/2017  . IBS (irritable bowel syndrome) 06/20/2009  . Menorrhagia 09/17/2010  . Morbid obesity ([place]) 06/20/2009  . Vitamin B 12 deficiency 04/13/2017    Past Surgical History:  Procedure Laterality Date  . ENDOMETRIAL ABLATION      Current Outpatient Medications  Medication Sig Dispense Refill  . cetirizine (ZYRTEC) 10 MG tablet Take 10 mg by mouth daily as needed for allergies.    . Cholecalciferol (VITAMIN D) 2000 UNITS CAPS Take 1 tablet by mouth daily.    . Multiple Vitamins-Minerals (MULTIVITAMIN WITH MINERALS) tablet Take 1 tablet by mouth daily.    Marland Kitchen triamcinolone (KENALOG) 0.025 % cream Apply 1 application topically 2 (two) times daily. 454 g 0   No current facility-administered medications for this visit.    Family History  Problem Relation Age of Onset  . Hyperlipidemia Mother   . Osteoporosis Mother   . Diabetes Father   . Mental illness Father   . Dementia Maternal Grandmother     Review  of Systems  All other systems reviewed and are negative.   Exam:   BP 122/72   Pulse 75   Ht 5\' 6"  (1.676 m)   Wt 270 lb (122.5 kg)   SpO2 98%   BMI 43.58 kg/m   Weight change: @WEIGHTCHANGE @ Height:   Height: 5\' 6"  (167.6 cm)  Ht Readings from Last 3 Encounters:  07/17/20 5\' 6"  (1.676 m)  07/16/19 5\' 7"  (1.702 m)  06/07/18 5' 6.75" (1.695 m)    General appearance: alert, cooperative and appears stated age Head: Normocephalic, without obvious abnormality, atraumatic Neck: no adenopathy, supple, symmetrical, trachea midline and thyroid normal to inspection and palpation Lungs: clear to auscultation bilaterally Cardiovascular: regular rate and rhythm Breasts: normal appearance, no masses or tenderness Abdomen: soft, non-tender; non distended,  no masses,  no organomegaly Extremities: extremities normal, atraumatic, no cyanosis or edema Skin: Skin color, texture, turgor normal. No rashes or lesions Lymph nodes: Cervical, supraclavicular, and axillary nodes normal. No abnormal inguinal nodes palpated Neurologic: Grossly normal   Pelvic: External genitalia:  no lesions              Urethra:  normal appearing urethra with no masses, tenderness or lesions              Bartholins and Skenes: normal  Vagina: normal appearing vagina with normal color and discharge, no lesions              Cervix: no lesions               Bimanual Exam:  Uterus:  normal size, contour, position, consistency, mobility, non-tender and anteverted              Adnexa: no mass, fullness, tenderness               Rectovaginal: Confirms               Anus:  normal sphincter tone, no lesions  Gae Dry chaperoned for the exam.  A:  Well Woman with normal exam  BMI 43  P:   No pap this year  Labs with other provider  Mammogram and colonoscopy are UTD  Discussed breast self exam  Discussed calcium and vit D intake

## 2020-07-17 ENCOUNTER — Encounter: Payer: Self-pay | Admitting: Obstetrics and Gynecology

## 2020-07-17 ENCOUNTER — Other Ambulatory Visit: Payer: Self-pay

## 2020-07-17 ENCOUNTER — Ambulatory Visit (INDEPENDENT_AMBULATORY_CARE_PROVIDER_SITE_OTHER): Payer: BC Managed Care – PPO | Admitting: Obstetrics and Gynecology

## 2020-07-17 VITALS — BP 122/72 | HR 75 | Ht 66.0 in | Wt 270.0 lb

## 2020-07-17 DIAGNOSIS — Z6841 Body Mass Index (BMI) 40.0 and over, adult: Secondary | ICD-10-CM

## 2020-07-17 DIAGNOSIS — Z01419 Encounter for gynecological examination (general) (routine) without abnormal findings: Secondary | ICD-10-CM

## 2020-07-17 NOTE — Patient Instructions (Signed)
EXERCISE AND DIET:  We recommended that you start or continue a regular exercise program for good health. Regular exercise means any activity that makes your heart beat faster and makes you sweat.  We recommend exercising at least 30 minutes per day at least 3 days a week, preferably 4 or 5.  We also recommend a diet low in fat and sugar.  Inactivity, poor dietary choices and obesity can cause diabetes, heart attack, stroke, and kidney damage, among others.    ALCOHOL AND SMOKING:  Women should limit their alcohol intake to no more than 7 drinks/beers/glasses of wine (combined, not each!) per week. Moderation of alcohol intake to this level decreases your risk of breast cancer and liver damage. And of course, no recreational drugs are part of a healthy lifestyle.  And absolutely no smoking or even second hand smoke. Most people know smoking can cause heart and lung diseases, but did you know it also contributes to weakening of your bones? Aging of your skin?  Yellowing of your teeth and nails?  CALCIUM AND VITAMIN D:  Adequate intake of calcium and Vitamin D are recommended.  The recommendations for exact amounts of these supplements seem to change often, but generally speaking 1,200 mg of calcium (between diet and supplement) and 800 units of Vitamin D per day seems prudent. Certain women may benefit from higher intake of Vitamin D.  If you are among these women, your doctor will have told you during your visit.    PAP SMEARS:  Pap smears, to check for cervical cancer or precancers,  have traditionally been done yearly, although recent scientific advances have shown that most women can have pap smears less often.  However, every woman still should have a physical exam from her gynecologist every year. It will include a breast check, inspection of the vulva and vagina to check for abnormal growths or skin changes, a visual exam of the cervix, and then an exam to evaluate the size and shape of the uterus and  ovaries.  And after 52 years of age, a rectal exam is indicated to check for rectal cancers. We will also provide age appropriate advice regarding health maintenance, like when you should have certain vaccines, screening for sexually transmitted diseases, bone density testing, colonoscopy, mammograms, etc.   MAMMOGRAMS:  All women over 40 years old should have a yearly mammogram. Many facilities now offer a "3D" mammogram, which may cost around $50 extra out of pocket. If possible,  we recommend you accept the option to have the 3D mammogram performed.  It both reduces the number of women who will be called back for extra views which then turn out to be normal, and it is better than the routine mammogram at detecting truly abnormal areas.    COLON CANCER SCREENING: Now recommend starting at age 45. At this time colonoscopy is not covered for routine screening until 50. There are take home tests that can be done between 45-49.   COLONOSCOPY:  Colonoscopy to screen for colon cancer is recommended for all women at age 50.  We know, you hate the idea of the prep.  We agree, BUT, having colon cancer and not knowing it is worse!!  Colon cancer so often starts as a polyp that can be seen and removed at colonscopy, which can quite literally save your life!  And if your first colonoscopy is normal and you have no family history of colon cancer, most women don't have to have it again for   10 years.  Once every ten years, you can do something that may end up saving your life, right?  We will be happy to help you get it scheduled when you are ready.  Be sure to check your insurance coverage so you understand how much it will cost.  It may be covered as a preventative service at no cost, but you should check your particular policy.      Breast Self-Awareness Breast self-awareness means being familiar with how your breasts look and feel. It involves checking your breasts regularly and reporting any changes to your  health care provider. Practicing breast self-awareness is important. A change in your breasts can be a sign of a serious medical problem. Being familiar with how your breasts look and feel allows you to find any problems early, when treatment is more likely to be successful. All women should practice breast self-awareness, including women who have had breast implants. How to do a breast self-exam One way to learn what is normal for your breasts and whether your breasts are changing is to do a breast self-exam. To do a breast self-exam: Look for Changes  1. Remove all the clothing above your waist. 2. Stand in front of a mirror in a room with good lighting. 3. Put your hands on your hips. 4. Push your hands firmly downward. 5. Compare your breasts in the mirror. Look for differences between them (asymmetry), such as: ? Differences in shape. ? Differences in size. ? Puckers, dips, and bumps in one breast and not the other. 6. Look at each breast for changes in your skin, such as: ? Redness. ? Scaly areas. 7. Look for changes in your nipples, such as: ? Discharge. ? Bleeding. ? Dimpling. ? Redness. ? A change in position. Feel for Changes Carefully feel your breasts for lumps and changes. It is best to do this while lying on your back on the floor and again while sitting or standing in the shower or tub with soapy water on your skin. Feel each breast in the following way:  Place the arm on the side of the breast you are examining above your head.  Feel your breast with the other hand.  Start in the nipple area and make  inch (2 cm) overlapping circles to feel your breast. Use the pads of your three middle fingers to do this. Apply light pressure, then medium pressure, then firm pressure. The light pressure will allow you to feel the tissue closest to the skin. The medium pressure will allow you to feel the tissue that is a little deeper. The firm pressure will allow you to feel the tissue  close to the ribs.  Continue the overlapping circles, moving downward over the breast until you feel your ribs below your breast.  Move one finger-width toward the center of the body. Continue to use the  inch (2 cm) overlapping circles to feel your breast as you move slowly up toward your collarbone.  Continue the up and down exam using all three pressures until you reach your armpit.  Write Down What You Find  Write down what is normal for each breast and any changes that you find. Keep a written record with breast changes or normal findings for each breast. By writing this information down, you do not need to depend only on memory for size, tenderness, or location. Write down where you are in your menstrual cycle, if you are still menstruating. If you are having trouble noticing differences   in your breasts, do not get discouraged. With time you will become more familiar with the variations in your breasts and more comfortable with the exam. How often should I examine my breasts? Examine your breasts every month. If you are breastfeeding, the best time to examine your breasts is after a feeding or after using a breast pump. If you menstruate, the best time to examine your breasts is 5-7 days after your period is over. During your period, your breasts are lumpier, and it may be more difficult to notice changes. When should I see my health care provider? See your health care provider if you notice:  A change in shape or size of your breasts or nipples.  A change in the skin of your breast or nipples, such as a reddened or scaly area.  Unusual discharge from your nipples.  A lump or thick area that was not there before.  Pain in your breasts.  Anything that concerns you.  

## 2020-07-22 DIAGNOSIS — M9903 Segmental and somatic dysfunction of lumbar region: Secondary | ICD-10-CM | POA: Diagnosis not present

## 2020-07-22 DIAGNOSIS — S83412A Sprain of medial collateral ligament of left knee, initial encounter: Secondary | ICD-10-CM | POA: Diagnosis not present

## 2020-07-22 DIAGNOSIS — M47816 Spondylosis without myelopathy or radiculopathy, lumbar region: Secondary | ICD-10-CM | POA: Diagnosis not present

## 2020-07-22 DIAGNOSIS — M9906 Segmental and somatic dysfunction of lower extremity: Secondary | ICD-10-CM | POA: Diagnosis not present

## 2020-08-05 DIAGNOSIS — M9906 Segmental and somatic dysfunction of lower extremity: Secondary | ICD-10-CM | POA: Diagnosis not present

## 2020-08-05 DIAGNOSIS — M9903 Segmental and somatic dysfunction of lumbar region: Secondary | ICD-10-CM | POA: Diagnosis not present

## 2020-08-05 DIAGNOSIS — M47816 Spondylosis without myelopathy or radiculopathy, lumbar region: Secondary | ICD-10-CM | POA: Diagnosis not present

## 2020-08-05 DIAGNOSIS — S83412A Sprain of medial collateral ligament of left knee, initial encounter: Secondary | ICD-10-CM | POA: Diagnosis not present

## 2020-08-19 DIAGNOSIS — M9903 Segmental and somatic dysfunction of lumbar region: Secondary | ICD-10-CM | POA: Diagnosis not present

## 2020-08-19 DIAGNOSIS — S83412A Sprain of medial collateral ligament of left knee, initial encounter: Secondary | ICD-10-CM | POA: Diagnosis not present

## 2020-08-19 DIAGNOSIS — M47816 Spondylosis without myelopathy or radiculopathy, lumbar region: Secondary | ICD-10-CM | POA: Diagnosis not present

## 2020-08-19 DIAGNOSIS — M9906 Segmental and somatic dysfunction of lower extremity: Secondary | ICD-10-CM | POA: Diagnosis not present

## 2020-09-17 DIAGNOSIS — L918 Other hypertrophic disorders of the skin: Secondary | ICD-10-CM | POA: Diagnosis not present

## 2020-09-17 DIAGNOSIS — D225 Melanocytic nevi of trunk: Secondary | ICD-10-CM | POA: Diagnosis not present

## 2020-09-17 DIAGNOSIS — D2261 Melanocytic nevi of right upper limb, including shoulder: Secondary | ICD-10-CM | POA: Diagnosis not present

## 2020-09-17 DIAGNOSIS — L858 Other specified epidermal thickening: Secondary | ICD-10-CM | POA: Diagnosis not present

## 2021-01-19 IMAGING — MG DIGITAL SCREENING BILAT W/ TOMO W/ CAD
8 series · 8 of 24 positions shown · non-contrast
Comparison: Previous exam(s).

CLINICAL DATA: Screening.

EXAM:
DIGITAL SCREENING BILATERAL MAMMOGRAM WITH TOMO AND CAD

[L MLO synth-2D]
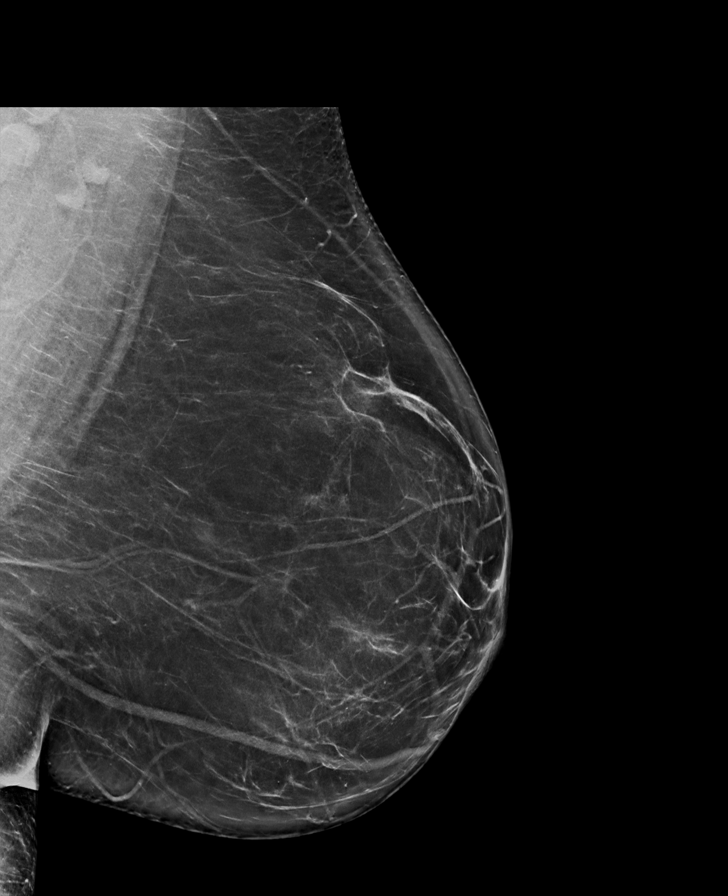

[R CC synth-2D]
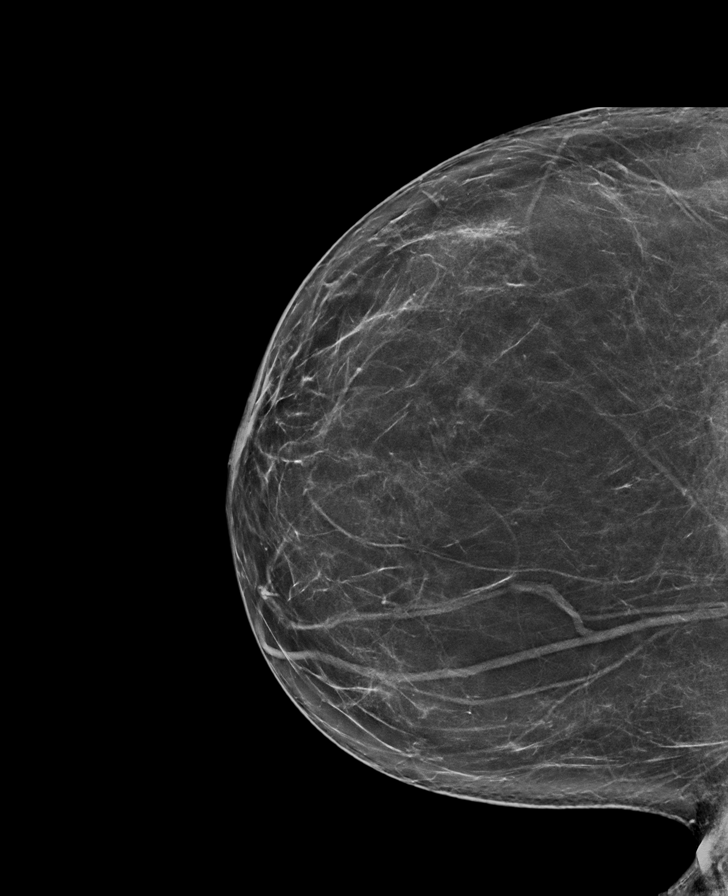

[R MLO synth-2D]
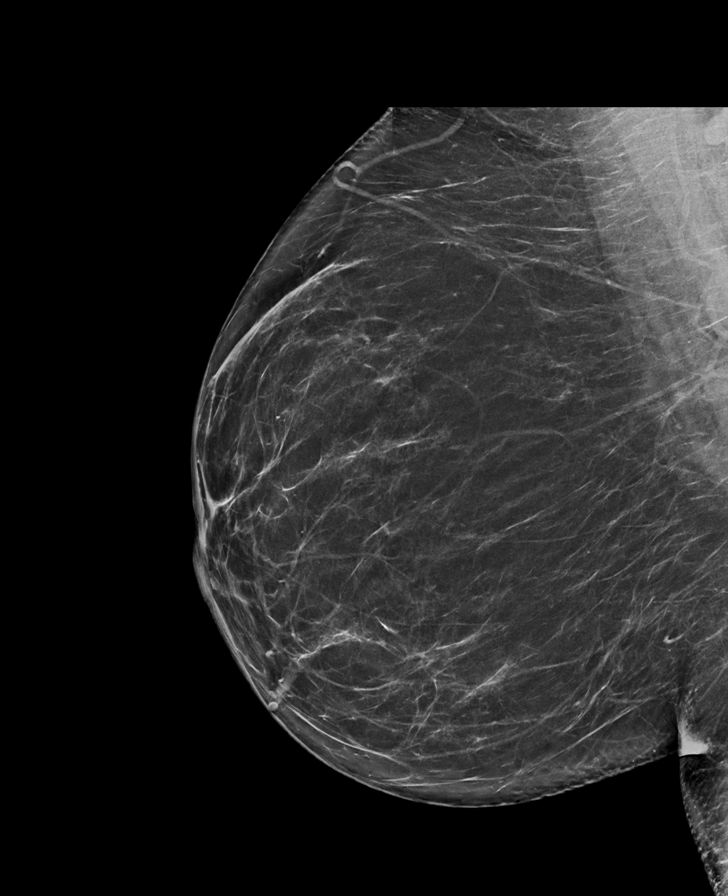

[L CC synth-2D]
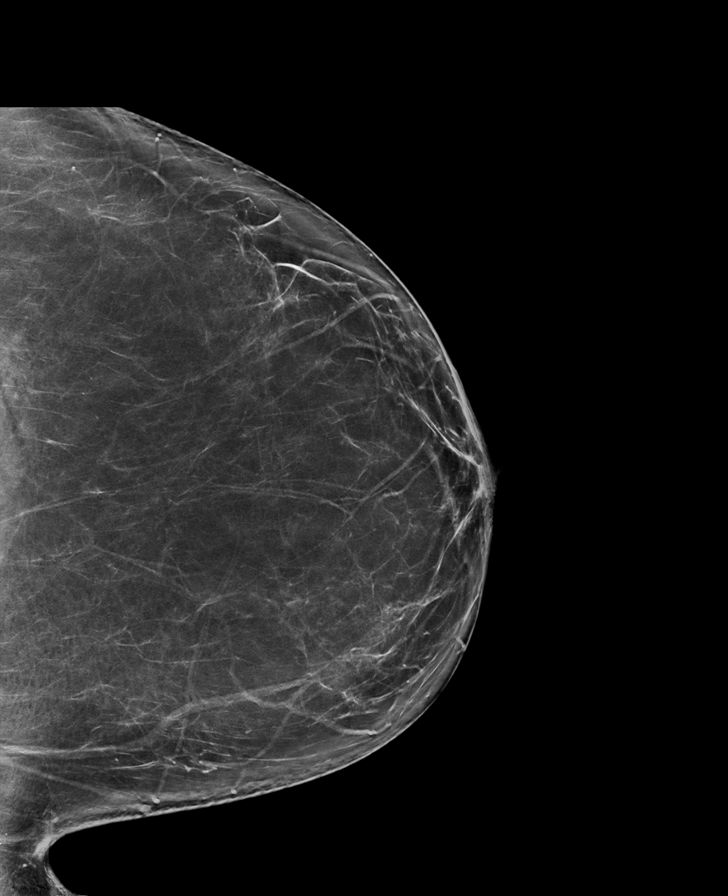

[R CC tomo · tomo slice 41/80.0]
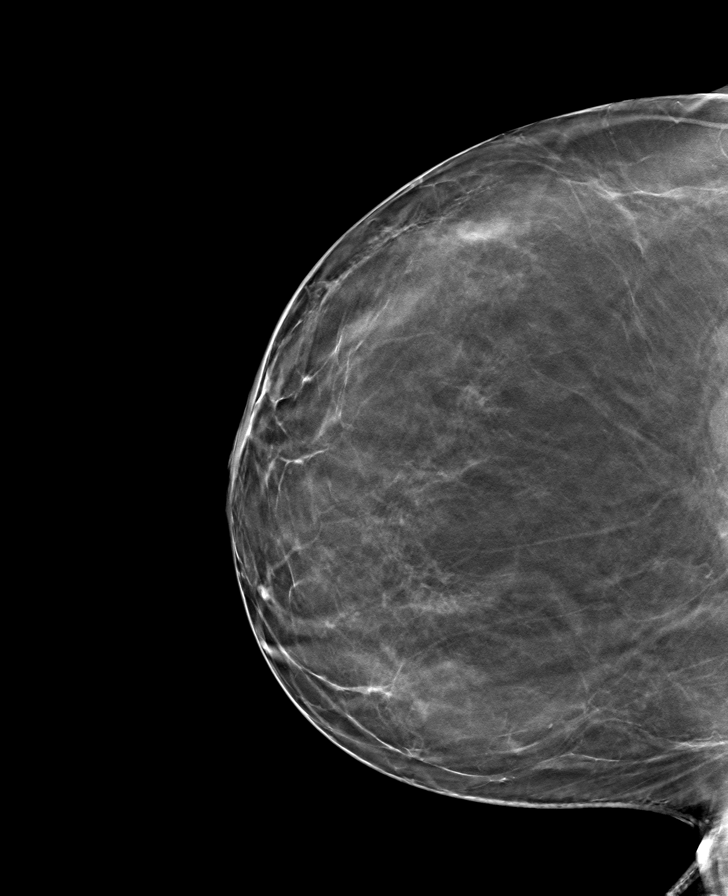

[L CC tomo · tomo slice 43/85.0]
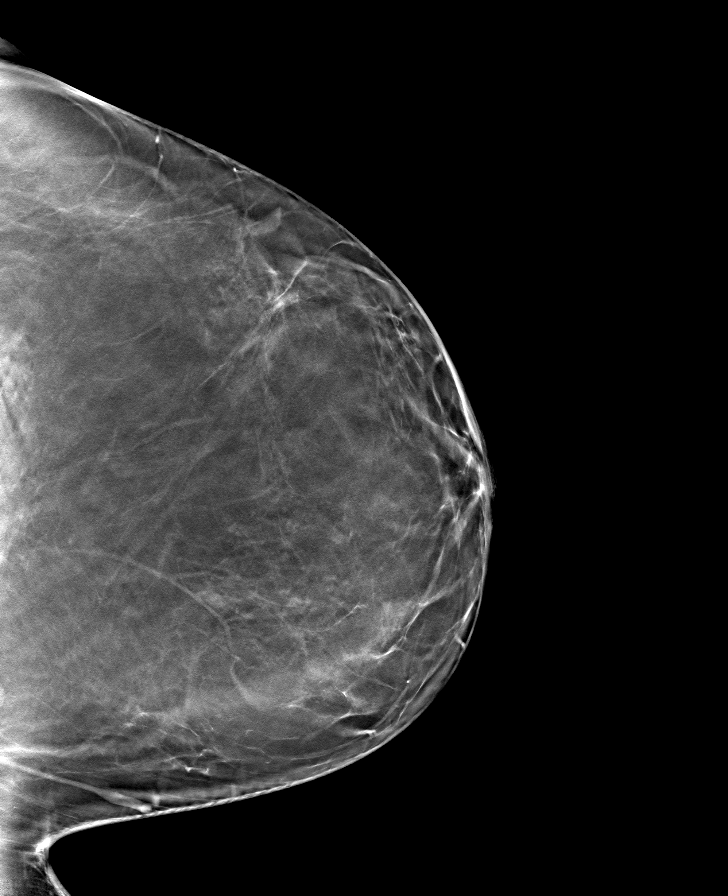

[R MLO tomo · tomo slice 43/84.0]
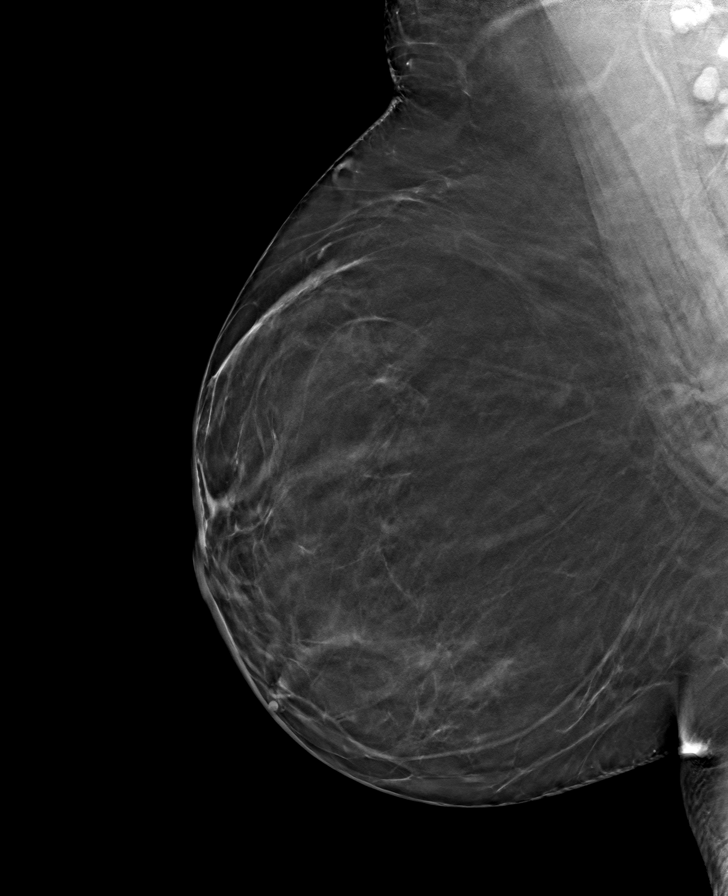

[L MLO tomo · tomo slice 45/89.0]
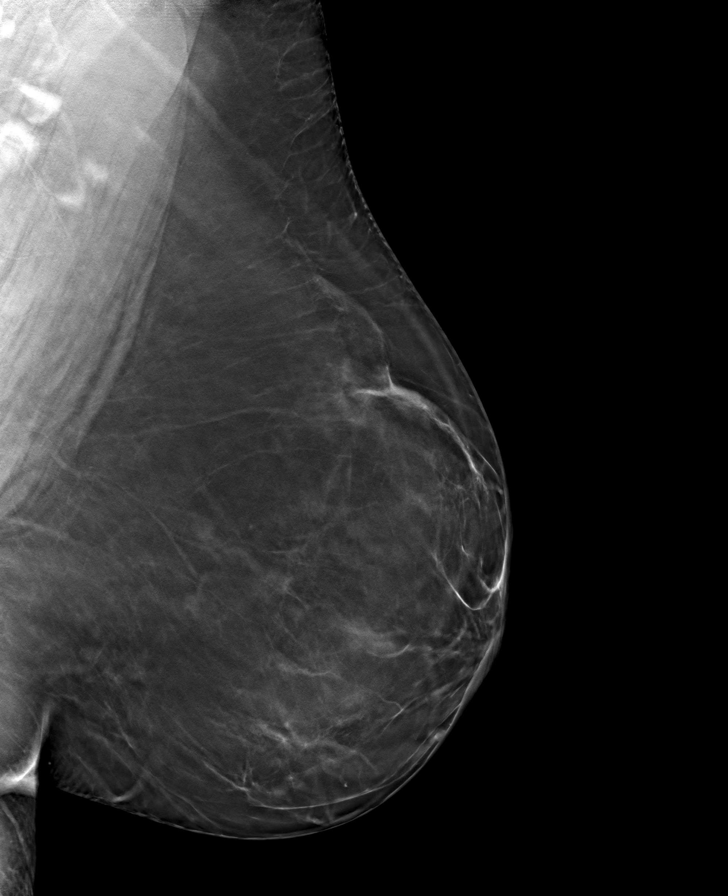

[8 of 24 positions shown; findings below may reference images not displayed]

ACR Breast Density Category b: There are scattered areas of
fibroglandular density.
FINDINGS: There are no findings suspicious for malignancy. Images were
processed with CAD.
IMPRESSION: No mammographic evidence of malignancy. A result letter of this
screening mammogram will be mailed directly to the patient.

RECOMMENDATION:
Screening mammogram in one year. (Code:[id number])

BI-RADS CATEGORY  1: Negative.

## 2021-04-14 DIAGNOSIS — E039 Hypothyroidism, unspecified: Secondary | ICD-10-CM | POA: Diagnosis not present

## 2021-04-14 DIAGNOSIS — E069 Thyroiditis, unspecified: Secondary | ICD-10-CM | POA: Diagnosis not present

## 2021-04-14 DIAGNOSIS — E559 Vitamin D deficiency, unspecified: Secondary | ICD-10-CM | POA: Diagnosis not present

## 2021-04-14 DIAGNOSIS — N951 Menopausal and female climacteric states: Secondary | ICD-10-CM | POA: Diagnosis not present

## 2021-07-21 NOTE — Progress Notes (Signed)
55 y.o. [id number] Married White or Caucasian Not Hispanic or Latino female here for annual exam. H/O an endometrial ablation. Had blood work last year with functional medicine provider, not menopausal.  She had some vasomotor symptoms, and low energy.   Had testosterone pellets inserted last week.   No bowel or bladder c/o. No dyspareunia.     No LMP recorded. Patient has had an ablation.          Sexually active: Yes.    The current method of family planning is none.    Exercising: Yes.     Pickle ball weights and walking  Smoker:  no  Health Maintenance: Pap:  06/01/17- WNL, HPV- neg  History of abnormal Pap:  no MMG:  01/18/20- birads 1   BMD:   n/a Colonoscopy: 2020- f/u in 10 years  TDaP:  unsure, declines.  Gardasil: none   reports that she has never smoked. She has never used smokeless tobacco. She reports current alcohol use of about 1.0 standard drink per week. She reports that she does not use drugs.  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 Son is a Administrator, arts at [employer], Facilities manager.   Past Medical History:  Diagnosis Date   Fibroid    Hyperlipidemia 04/13/2017   IBS (irritable bowel syndrome) 06/20/2009   Menorrhagia 09/17/2010   Morbid obesity ([place]) 06/20/2009   Vitamin B 12 deficiency 04/13/2017    Past Surgical History:  Procedure Laterality Date   ENDOMETRIAL ABLATION      Current Outpatient Medications  Medication Sig Dispense Refill   cetirizine (ZYRTEC) 10 MG tablet Take 10 mg by mouth daily as needed for allergies.     Cholecalciferol (VITAMIN D) 2000 UNITS CAPS Take 1 tablet by mouth daily.     meloxicam (MOBIC) 15 MG tablet Take 15 mg by mouth daily.     Multiple Vitamins-Minerals (MULTIVITAMIN WITH MINERALS) tablet Take 1 tablet by mouth daily.     OZEMPIC, 1 MG/DOSE, 4 MG/3ML SOPN Inject 1 mg into the skin once a week.     Probiotic Product (PROBIOTIC ADVANCED PO) Take by mouth.     progesterone (PROMETRIUM) 100 MG capsule Take 100 mg by  mouth at bedtime.     UNABLE TO FIND Med Name: testosterone pellets     UNABLE TO FIND Med Name: lion's Main mushroom pwd     Zinc 30 MG TABS Take by mouth.     No current facility-administered medications for this visit.    Family History  Problem Relation Age of Onset   Hyperlipidemia Mother    Osteoporosis Mother    Diabetes Father    Mental illness Father    Dementia Maternal Grandmother   Mom with hip fracture  Review of Systems  All other systems reviewed and are negative.  Exam:   BP 126/74   Pulse 82   Ht 5\' 8"  (1.727 m)   Wt 255 lb (115.7 kg)   SpO2 100%   BMI 38.77 kg/m   Weight change: @WEIGHTCHANGE @ Height:   Height: 5\' 8"  (172.7 cm)  Ht Readings from Last 3 Encounters:  07/23/21 5\' 8"  (1.727 m)  07/17/20 5\' 6"  (1.676 m)  07/16/19 5\' 7"  (1.702 m)    General appearance: alert, cooperative and appears stated age Head: Normocephalic, without obvious abnormality, atraumatic Neck: no adenopathy, supple, symmetrical, trachea midline and thyroid normal to inspection and palpation Lungs: clear to auscultation bilaterally Cardiovascular: regular rate and rhythm Breasts: normal appearance, no  masses or tenderness Abdomen: soft, non-tender; non distended,  no masses,  no organomegaly Extremities: extremities normal, atraumatic, no cyanosis or edema Skin: Skin color, texture, turgor normal. No rashes or lesions Lymph nodes: Cervical, supraclavicular, and axillary nodes normal. No abnormal inguinal nodes palpated Neurologic: Grossly normal   Pelvic: External genitalia:  no lesions              Urethra:  normal appearing urethra with no masses, tenderness or lesions              Bartholins and Skenes: normal                 Vagina: normal appearing vagina with normal color and discharge, no lesions              Cervix: no lesions               Bimanual Exam:  Uterus:   no masses, no tenderness              Adnexa: no mass, fullness, tenderness                Rectovaginal: Confirms               Anus:  normal sphincter tone, no lesions  Karmen Bongo, RN chaperoned for the exam.  1. Well woman exam Discussed breast self exam Discussed calcium and vit D intake Mammogram due, she will schedul Colonoscopy UTD Labs with [facility] Provider

## 2021-07-23 ENCOUNTER — Encounter: Payer: Self-pay | Admitting: Obstetrics and Gynecology

## 2021-07-23 ENCOUNTER — Other Ambulatory Visit: Payer: Self-pay

## 2021-07-23 ENCOUNTER — Ambulatory Visit (INDEPENDENT_AMBULATORY_CARE_PROVIDER_SITE_OTHER): Payer: BC Managed Care – PPO | Admitting: Obstetrics and Gynecology

## 2021-07-23 VITALS — BP 126/74 | HR 82 | Ht 68.0 in | Wt 255.0 lb

## 2021-07-23 DIAGNOSIS — Z01419 Encounter for gynecological examination (general) (routine) without abnormal findings: Secondary | ICD-10-CM | POA: Diagnosis not present

## 2021-07-23 NOTE — Patient Instructions (Signed)

## 2021-08-12 ENCOUNTER — Other Ambulatory Visit (HOSPITAL_BASED_OUTPATIENT_CLINIC_OR_DEPARTMENT_OTHER): Payer: Self-pay | Admitting: Obstetrics and Gynecology

## 2021-08-12 DIAGNOSIS — Z1231 Encounter for screening mammogram for malignant neoplasm of breast: Secondary | ICD-10-CM

## 2021-08-17 ENCOUNTER — Other Ambulatory Visit: Payer: Self-pay

## 2021-08-17 ENCOUNTER — Ambulatory Visit (HOSPITAL_BASED_OUTPATIENT_CLINIC_OR_DEPARTMENT_OTHER)
Admission: RE | Admit: 2021-08-17 | Discharge: 2021-08-17 | Disposition: A | Discharge status: Home / Self Care | Payer: BC Managed Care – PPO | Source: Ambulatory Visit | Attending: Obstetrics and Gynecology | Admitting: Obstetrics and Gynecology

## 2021-08-17 DIAGNOSIS — Z1231 Encounter for screening mammogram for malignant neoplasm of breast: Secondary | ICD-10-CM | POA: Insufficient documentation

## 2021-09-23 DIAGNOSIS — Z0000 Encounter for general adult medical examination without abnormal findings: Secondary | ICD-10-CM | POA: Diagnosis not present

## 2021-09-29 ENCOUNTER — Other Ambulatory Visit (HOSPITAL_BASED_OUTPATIENT_CLINIC_OR_DEPARTMENT_OTHER): Payer: Self-pay

## 2021-09-29 ENCOUNTER — Other Ambulatory Visit: Payer: Self-pay

## 2021-09-29 ENCOUNTER — Encounter (HOSPITAL_BASED_OUTPATIENT_CLINIC_OR_DEPARTMENT_OTHER): Payer: Self-pay

## 2021-09-29 ENCOUNTER — Emergency Department (HOSPITAL_BASED_OUTPATIENT_CLINIC_OR_DEPARTMENT_OTHER)
Admission: EM | Admit: 2021-09-29 | Discharge: 2021-09-29 | Disposition: A | Discharge status: Home / Self Care | Payer: BC Managed Care – PPO | Attending: Emergency Medicine | Admitting: Emergency Medicine

## 2021-09-29 ENCOUNTER — Emergency Department (HOSPITAL_BASED_OUTPATIENT_CLINIC_OR_DEPARTMENT_OTHER): Payer: BC Managed Care – PPO | Admitting: Radiology

## 2021-09-29 DIAGNOSIS — Z043 Encounter for examination and observation following other accident: Secondary | ICD-10-CM | POA: Diagnosis not present

## 2021-09-29 DIAGNOSIS — M25562 Pain in left knee: Secondary | ICD-10-CM | POA: Diagnosis not present

## 2021-09-29 DIAGNOSIS — M25561 Pain in right knee: Secondary | ICD-10-CM | POA: Insufficient documentation

## 2021-09-29 DIAGNOSIS — M25522 Pain in left elbow: Secondary | ICD-10-CM | POA: Insufficient documentation

## 2021-09-29 DIAGNOSIS — W19XXXA Unspecified fall, initial encounter: Secondary | ICD-10-CM

## 2021-09-29 DIAGNOSIS — R0781 Pleurodynia: Secondary | ICD-10-CM | POA: Diagnosis not present

## 2021-09-29 DIAGNOSIS — W010XXA Fall on same level from slipping, tripping and stumbling without subsequent striking against object, initial encounter: Secondary | ICD-10-CM | POA: Diagnosis not present

## 2021-09-29 MED ORDER — OZEMPIC (2 MG/DOSE) 8 MG/3ML ~~LOC~~ SOPN
2.0000 mg | PEN_INJECTOR | SUBCUTANEOUS | 5 refills | Status: DC
  Filled 2021-09-29: qty 9, 84d supply, fill #0

## 2021-09-29 MED ORDER — KETOROLAC TROMETHAMINE 15 MG/ML IJ SOLN
30.0000 mg | Freq: Once | INTRAMUSCULAR | Status: AC
  Administered 2021-09-29: 30 mg via INTRAMUSCULAR
  Filled 2021-09-29: qty 2

## 2021-09-29 NOTE — Discharge Instructions (Signed)
You have been seen in the Emergency Department today. Your Xray did not reveal any fractures of your ribs. You likely are experiencing soreness related to the fall. You can use Ibuprofen or Tylenol for you pain at home. You can also use a heating pad for your symptoms.

## 2021-09-29 NOTE — ED Triage Notes (Signed)
Fall last night while vacuuming onto hard wood floor, states she fell on all fours onto knees and elbows. States she was fine until 3-4 hours after fall and now has mid right sided chest pain with bilat knee pain. States pain is worse when laughing or coughing. No med intervention

## 2021-09-29 NOTE — ED Provider Notes (Addendum)
[facility] EMERGENCY DEPT Provider Note   CSN: [id number] Arrival date & time: 09/29/21  1015     History  Chief Complaint  Patient presents with   Katrina Hernandez is a 56 y.o. female. PMH of obesity, HLD, IBS, Chronic right knee pain.  Patient presents the emergency department after a fall yesterday afternoon while she was vacuuming.  She thinks she tripped on something and fell straight forward onto her chest.  She knocked the air out of her self.  She says she was fine until about 3 to 4 hours after fall and now started experiencing some right-sided rib cage pain.  It hurts worse when she laughs, or coughs.  Hurts worse when she takes deep breath.  She also is complaining of bilateral knee pain, however she has been able to ambulate normally and pain is more significant when walking around.  She says she is chronically had problems in her knees, so this is just exasperated symptoms.   Fall      Home Medications Prior to Admission medications   Medication Sig Start Date End Date Taking? Authorizing Provider  cetirizine (ZYRTEC) 10 MG tablet Take 10 mg by mouth daily as needed for allergies.   Yes [provider]  Cholecalciferol (VITAMIN D) 2000 UNITS CAPS Take 1 tablet by mouth daily. 06/15/11  Yes [provider]  meloxicam (MOBIC) 15 MG tablet Take 15 mg by mouth daily. 07/21/21  Yes [provider]  Probiotic Product (PROBIOTIC ADVANCED PO) Take by mouth.   Yes [provider]  progesterone (PROMETRIUM) 100 MG capsule Take 100 mg by mouth at bedtime. 07/18/21  Yes [provider]  UNABLE TO FIND Med Name: lion's Main mushroom pwd   Yes [provider]  Zinc 30 MG TABS Take by mouth.   Yes [provider]  OZEMPIC, 1 MG/DOSE, 4 MG/3ML SOPN Inject 1 mg into the skin once a week. 04/22/21   [provider]  UNABLE TO FIND Med Name: testosterone pellets    [provider]       Allergies    Tetracyclines & related    Review of Systems   Review of Systems  Cardiovascular:        Right rib cage pain  Musculoskeletal:  Positive for arthralgias.  All other systems reviewed and are negative.  Physical Exam Updated Vital Signs BP 136/74 (BP Location: Right Arm)    Pulse 66    Temp 98.2 F (36.8 C) (Oral)    Resp 16    Ht 5\' 8"  (1.727 m)    Wt 113.4 kg    SpO2 100%    BMI 38.01 kg/m  Physical Exam Vitals and nursing note reviewed.  Constitutional:      General: She is not in acute distress.    Appearance: Normal appearance. She is well-developed. She is not ill-appearing, toxic-appearing or diaphoretic.  HENT:     Head: Normocephalic and atraumatic.     Nose: No nasal deformity.     Mouth/Throat:     Lips: Pink. No lesions.  Eyes:     General: Gaze aligned appropriately. No scleral icterus.       Right eye: No discharge.        Left eye: No discharge.     Conjunctiva/sclera: Conjunctivae normal.     Right eye: Right conjunctiva is not injected. No exudate or hemorrhage.    Left eye: Left conjunctiva is not injected. No exudate  or hemorrhage. Cardiovascular:     Pulses: Normal pulses.          Radial pulses are 2+ on the right side and 2+ on the left side.       Dorsalis pedis pulses are 2+ on the right side and 2+ on the left side.  Pulmonary:     Effort: Pulmonary effort is normal. No respiratory distress.     Comments: There is equal chest rise bilaterally. Chest:     Chest wall: Tenderness present.       Comments: Tenderness is to the right lateral anterior area beneath the right breast overlying rib cage  Abdominal:     General: Abdomen is flat.     Palpations: Abdomen is soft.     Tenderness: There is no abdominal tenderness. There is no guarding or rebound.  Musculoskeletal:     Right elbow: Normal.     Left elbow: Normal. No swelling, deformity or lacerations. Normal range of motion. No tenderness.     Right knee: Normal. No swelling,  deformity, ecchymosis, bony tenderness or crepitus. Normal range of motion. No tenderness.     Left knee: Normal. No swelling, deformity, ecchymosis, bony tenderness or crepitus. Normal range of motion. No tenderness.     Right lower leg: No edema.     Left lower leg: No edema.  Skin:    General: Skin is warm and dry.  Neurological:     Mental Status: She is alert and oriented to person, place, and time.  Psychiatric:        Mood and Affect: Mood normal.        Speech: Speech normal.        Behavior: Behavior normal. Behavior is cooperative.    ED Results / Procedures / Treatments   Labs (all labs ordered are listed, but only abnormal results are displayed) Labs Reviewed - No data to display  EKG None  Radiology DG Ribs Unilateral W/Chest Right  Result Date: 09/29/2021 CLINICAL DATA:  Fall, right flank pain EXAM: RIGHT RIBS AND CHEST - 3+ VIEW COMPARISON:  None. FINDINGS: No fracture or other bone lesions are seen involving the ribs. There is no evidence of pneumothorax or pleural effusion. Both lungs are clear. Heart size and mediastinal contours are within normal limits. IMPRESSION: Negative. Electronically Signed   By: Keane Police D.O.   On: 09/29/2021 13:26    Procedures Procedures   Medications Ordered in ED Medications  ketorolac (TORADOL) 15 MG/ML injection 30 mg (30 mg Intramuscular Given 09/29/21 1318)    ED Course/ Medical Decision Making/ A&P                           Medical Decision Making Problems Addressed: Rib pain on right side: self-limited or minor problem  Amount and/or Complexity of Data Reviewed Radiology: ordered and independent interpretation performed. Decision-making details documented in ED Course.  Risk OTC drugs.   This is a 56 y.o. female with a PMH of obesity, HLD, IBS, Chronic right knee pain who presents to the ED after a mechanical fall yesterday. She complains of right rib cage pain, bilateral knee pain, and left elbow pain.  X-ray  obtained of right rib cage.  It is negative for fractures.  Patient is able to range of motion and has full strength in bilateral knees and left elbow.  Do not feel need for imaging at this time.  Patient likely has musculoskeletal injuries related  to her fall.  Supportive treatment recommended at home.  Follow-up with PCP if symptoms are not improving.  Portions of this note were generated with [employer]. Dictation errors may occur despite best attempts at proofreading.  Final Clinical Impression(s) / ED Diagnoses Final diagnoses:  Fall, initial encounter  Rib pain on right side    Rx / DC Orders ED Discharge Orders     None         Adolphus Birchwood, PA-C 09/29/21 1339    Paislee Szatkowski, Adora Fridge, PA-C 09/29/21 1339    Malvin Johns, MD 09/29/21 1523

## 2021-09-29 NOTE — ED Notes (Signed)
REMT-Pprovided AVS using Teachback Method. Patient verbalizes understanding of Discharge Instructions. Opportunity for Questioning and Answers were provided by EMT-P. Patient Discharged from ED.

## 2021-09-29 NOTE — ED Notes (Signed)
ED Provider at bedside. 

## 2021-09-29 NOTE — ED Notes (Signed)
Patient transported to X-ray 

## 2021-11-23 DIAGNOSIS — N951 Menopausal and female climacteric states: Secondary | ICD-10-CM | POA: Diagnosis not present

## 2021-11-23 DIAGNOSIS — E039 Hypothyroidism, unspecified: Secondary | ICD-10-CM | POA: Diagnosis not present

## 2021-11-23 DIAGNOSIS — E8881 Metabolic syndrome: Secondary | ICD-10-CM | POA: Diagnosis not present

## 2021-11-23 DIAGNOSIS — R799 Abnormal finding of blood chemistry, unspecified: Secondary | ICD-10-CM | POA: Diagnosis not present

## 2021-12-02 DIAGNOSIS — D225 Melanocytic nevi of trunk: Secondary | ICD-10-CM | POA: Diagnosis not present

## 2021-12-02 DIAGNOSIS — L821 Other seborrheic keratosis: Secondary | ICD-10-CM | POA: Diagnosis not present

## 2021-12-02 DIAGNOSIS — L298 Other pruritus: Secondary | ICD-10-CM | POA: Diagnosis not present

## 2022-01-27 ENCOUNTER — Other Ambulatory Visit (HOSPITAL_BASED_OUTPATIENT_CLINIC_OR_DEPARTMENT_OTHER): Payer: Self-pay

## 2022-01-27 MED ORDER — MOUNJARO 7.5 MG/0.5ML ~~LOC~~ SOAJ
SUBCUTANEOUS | 0 refills | Status: DC
  Filled 2022-03-12: qty 2, 28d supply, fill #0

## 2022-01-27 MED ORDER — MOUNJARO 10 MG/0.5ML ~~LOC~~ SOAJ
SUBCUTANEOUS | 2 refills | Status: DC
  Filled 2022-04-22: qty 2, 28d supply, fill #0
  Filled 2022-05-16: qty 2, 28d supply, fill #1
  Filled 2022-06-18: qty 2, 28d supply, fill #2

## 2022-01-27 MED ORDER — MOUNJARO 5 MG/0.5ML ~~LOC~~ SOAJ
SUBCUTANEOUS | 0 refills | Status: DC
  Filled 2022-01-27: qty 2, 28d supply, fill #0

## 2022-03-12 ENCOUNTER — Other Ambulatory Visit (HOSPITAL_BASED_OUTPATIENT_CLINIC_OR_DEPARTMENT_OTHER): Payer: Self-pay

## 2022-04-22 ENCOUNTER — Other Ambulatory Visit (HOSPITAL_BASED_OUTPATIENT_CLINIC_OR_DEPARTMENT_OTHER): Payer: Self-pay

## 2022-04-23 ENCOUNTER — Other Ambulatory Visit (HOSPITAL_BASED_OUTPATIENT_CLINIC_OR_DEPARTMENT_OTHER): Payer: Self-pay

## 2022-05-18 ENCOUNTER — Other Ambulatory Visit (HOSPITAL_BASED_OUTPATIENT_CLINIC_OR_DEPARTMENT_OTHER): Payer: Self-pay

## 2022-05-20 ENCOUNTER — Other Ambulatory Visit (HOSPITAL_BASED_OUTPATIENT_CLINIC_OR_DEPARTMENT_OTHER): Payer: Self-pay

## 2022-05-21 ENCOUNTER — Other Ambulatory Visit (HOSPITAL_BASED_OUTPATIENT_CLINIC_OR_DEPARTMENT_OTHER): Payer: Self-pay

## 2022-06-03 DIAGNOSIS — E119 Type 2 diabetes mellitus without complications: Secondary | ICD-10-CM | POA: Diagnosis not present

## 2022-06-03 DIAGNOSIS — H53143 Visual discomfort, bilateral: Secondary | ICD-10-CM | POA: Diagnosis not present

## 2022-06-18 ENCOUNTER — Other Ambulatory Visit (HOSPITAL_BASED_OUTPATIENT_CLINIC_OR_DEPARTMENT_OTHER): Payer: Self-pay

## 2022-06-21 ENCOUNTER — Other Ambulatory Visit (HOSPITAL_BASED_OUTPATIENT_CLINIC_OR_DEPARTMENT_OTHER): Payer: Self-pay

## 2022-06-21 MED ORDER — MOUNJARO 12.5 MG/0.5ML ~~LOC~~ SOAJ
12.5000 mg | SUBCUTANEOUS | 2 refills | Status: DC
Start: 2022-06-21 — End: 2022-12-16
  Filled 2022-06-21: qty 2, 28d supply, fill #0
  Filled 2022-07-16: qty 2, 28d supply, fill #1
  Filled 2022-08-14: qty 2, 28d supply, fill #2

## 2022-06-23 ENCOUNTER — Other Ambulatory Visit (HOSPITAL_BASED_OUTPATIENT_CLINIC_OR_DEPARTMENT_OTHER): Payer: Self-pay

## 2022-07-17 ENCOUNTER — Other Ambulatory Visit (HOSPITAL_BASED_OUTPATIENT_CLINIC_OR_DEPARTMENT_OTHER): Payer: Self-pay

## 2022-08-02 ENCOUNTER — Other Ambulatory Visit (HOSPITAL_BASED_OUTPATIENT_CLINIC_OR_DEPARTMENT_OTHER): Payer: Self-pay

## 2022-08-02 MED ORDER — MOUNJARO 12.5 MG/0.5ML ~~LOC~~ SOAJ
12.5000 mg | SUBCUTANEOUS | 1 refills | Status: DC
Start: 2022-08-02 — End: 2022-12-16
  Filled 2022-08-02 – 2022-09-12 (×2): qty 2, 28d supply, fill #0

## 2022-08-02 MED ORDER — ESTRADIOL 0.05 MG/24HR TD PTTW
1.0000 | MEDICATED_PATCH | TRANSDERMAL | 1 refills | Status: AC
  Filled 2022-08-02: qty 24, 84d supply, fill #0
  Filled 2022-08-14 – 2022-10-20 (×2): qty 24, 84d supply, fill #1

## 2022-08-11 DIAGNOSIS — M25562 Pain in left knee: Secondary | ICD-10-CM | POA: Diagnosis not present

## 2022-08-14 ENCOUNTER — Other Ambulatory Visit (HOSPITAL_BASED_OUTPATIENT_CLINIC_OR_DEPARTMENT_OTHER): Payer: Self-pay

## 2022-08-16 ENCOUNTER — Other Ambulatory Visit (HOSPITAL_BASED_OUTPATIENT_CLINIC_OR_DEPARTMENT_OTHER): Payer: Self-pay

## 2022-09-13 ENCOUNTER — Encounter (HOSPITAL_BASED_OUTPATIENT_CLINIC_OR_DEPARTMENT_OTHER): Payer: Self-pay | Admitting: Pharmacist

## 2022-09-13 ENCOUNTER — Other Ambulatory Visit (HOSPITAL_BASED_OUTPATIENT_CLINIC_OR_DEPARTMENT_OTHER): Payer: Self-pay

## 2022-09-14 ENCOUNTER — Other Ambulatory Visit (HOSPITAL_BASED_OUTPATIENT_CLINIC_OR_DEPARTMENT_OTHER): Payer: Self-pay

## 2022-09-14 MED ORDER — MOUNJARO 15 MG/0.5ML ~~LOC~~ SOAJ
15.0000 mg | SUBCUTANEOUS | 2 refills | Status: DC
  Filled 2022-09-14 – 2022-09-22 (×2): qty 2, 28d supply, fill #0
  Filled 2022-10-26: qty 2, 28d supply, fill #1
  Filled 2022-11-18 – 2022-11-19 (×2): qty 2, 28d supply, fill #2

## 2022-09-16 ENCOUNTER — Other Ambulatory Visit (HOSPITAL_BASED_OUTPATIENT_CLINIC_OR_DEPARTMENT_OTHER): Payer: Self-pay

## 2022-09-20 ENCOUNTER — Other Ambulatory Visit (HOSPITAL_BASED_OUTPATIENT_CLINIC_OR_DEPARTMENT_OTHER): Payer: Self-pay

## 2022-09-21 ENCOUNTER — Other Ambulatory Visit (HOSPITAL_BASED_OUTPATIENT_CLINIC_OR_DEPARTMENT_OTHER): Payer: Self-pay

## 2022-09-22 ENCOUNTER — Other Ambulatory Visit (HOSPITAL_BASED_OUTPATIENT_CLINIC_OR_DEPARTMENT_OTHER): Payer: Self-pay

## 2022-10-26 ENCOUNTER — Other Ambulatory Visit (HOSPITAL_BASED_OUTPATIENT_CLINIC_OR_DEPARTMENT_OTHER): Payer: Self-pay

## 2022-11-01 DIAGNOSIS — E039 Hypothyroidism, unspecified: Secondary | ICD-10-CM | POA: Diagnosis not present

## 2022-11-01 DIAGNOSIS — Z780 Asymptomatic menopausal state: Secondary | ICD-10-CM | POA: Diagnosis not present

## 2022-11-01 DIAGNOSIS — E559 Vitamin D deficiency, unspecified: Secondary | ICD-10-CM | POA: Diagnosis not present

## 2022-11-17 DIAGNOSIS — M1712 Unilateral primary osteoarthritis, left knee: Secondary | ICD-10-CM | POA: Insufficient documentation

## 2022-11-18 ENCOUNTER — Other Ambulatory Visit (HOSPITAL_BASED_OUTPATIENT_CLINIC_OR_DEPARTMENT_OTHER): Payer: Self-pay

## 2022-11-19 ENCOUNTER — Other Ambulatory Visit (HOSPITAL_BASED_OUTPATIENT_CLINIC_OR_DEPARTMENT_OTHER): Payer: Self-pay

## 2022-11-23 ENCOUNTER — Other Ambulatory Visit (HOSPITAL_BASED_OUTPATIENT_CLINIC_OR_DEPARTMENT_OTHER): Payer: Self-pay

## 2022-11-29 DIAGNOSIS — Z0000 Encounter for general adult medical examination without abnormal findings: Secondary | ICD-10-CM | POA: Diagnosis not present

## 2022-12-06 ENCOUNTER — Other Ambulatory Visit (HOSPITAL_BASED_OUTPATIENT_CLINIC_OR_DEPARTMENT_OTHER): Payer: Self-pay | Admitting: Obstetrics and Gynecology

## 2022-12-06 DIAGNOSIS — Z13820 Encounter for screening for osteoporosis: Secondary | ICD-10-CM | POA: Diagnosis not present

## 2022-12-06 DIAGNOSIS — M25562 Pain in left knee: Secondary | ICD-10-CM | POA: Diagnosis not present

## 2022-12-06 DIAGNOSIS — Z1231 Encounter for screening mammogram for malignant neoplasm of breast: Secondary | ICD-10-CM

## 2022-12-07 ENCOUNTER — Ambulatory Visit (HOSPITAL_BASED_OUTPATIENT_CLINIC_OR_DEPARTMENT_OTHER)
Admission: RE | Admit: 2022-12-07 | Discharge: 2022-12-07 | Disposition: A | Discharge status: Home / Self Care | Payer: BC Managed Care – PPO | Source: Ambulatory Visit | Attending: Obstetrics and Gynecology | Admitting: Obstetrics and Gynecology

## 2022-12-07 DIAGNOSIS — Z1231 Encounter for screening mammogram for malignant neoplasm of breast: Secondary | ICD-10-CM | POA: Insufficient documentation

## 2022-12-08 NOTE — Progress Notes (Signed)
57 y.o. [id number] Married White or Caucasian Not Hispanic or Latino female here for annual exam.  H/O an endometrial ablation. No vaginal bleeding.   She is on HRT and topical testosterone with her Functional medicine provider.    She is having a knee replacement in a few weeks.   No LMP recorded. Patient has had an ablation.          Sexually active: Yes.    The current method of family planning is post menopausal status.    Exercising: Yes.     Weights and cardio  Smoker:  no  Health Maintenance: Pap:   06/01/17- WNL, HPV- neg  History of abnormal Pap:  no MMG:  12/07/22 density A Bi-rads incomplete follow up next week  BMD:   normal 2 weeks ago.  Colonoscopy: 2020- f/u in 10 years  TDaP:   unsure, declines.  Gardasil: none   reports that she has never smoked. She has never used smokeless tobacco. She reports current alcohol use of about 1.0 standard drink of alcohol per week. She reports that she does not use drugs. Homemaker. Son is working, daughter is at [employer]. Daughter was just treated for thyroid cancer, doing okay.   Past Medical History:  Diagnosis Date   Fibroid    Hyperlipidemia 04/13/2017   IBS (irritable bowel syndrome) 06/20/2009   Menorrhagia 09/17/2010   Morbid obesity 06/20/2009   Vitamin B 12 deficiency 04/13/2017    Past Surgical History:  Procedure Laterality Date   ENDOMETRIAL ABLATION      Current Outpatient Medications  Medication Sig Dispense Refill   cetirizine (ZYRTEC) 10 MG tablet Take 10 mg by mouth daily as needed for allergies.     Cholecalciferol (VITAMIN D) 2000 UNITS CAPS Take 1 tablet by mouth daily.     estradiol (VIVELLE-DOT) 0.05 MG/24HR patch Apply 1 patch to the skin 2 times per week 24 patch 1   meloxicam (MOBIC) 15 MG tablet Take 15 mg by mouth daily.     Probiotic Product (PROBIOTIC ADVANCED PO) Take by mouth.     progesterone (PROMETRIUM) 100 MG capsule Take 100 mg by mouth at bedtime.     Testosterone 20 % CREA by Does not apply route.      tirzepatide (MOUNJARO) 15 MG/0.5ML Pen Inject 15 mg into the skin once a week. 2 mL 2   Zinc 30 MG TABS Take by mouth.     No current facility-administered medications for this visit.    Family History  Problem Relation Age of Onset   Hyperlipidemia Mother    Osteoporosis Mother    Diabetes Father    Mental illness Father    Dementia Maternal Grandmother     Review of Systems  All other systems reviewed and are negative.   Exam:   BP 122/72   Pulse 65   Ht 5\' 8"  (1.727 m)   Wt 231 lb (104.8 kg)   SpO2 100%   BMI 35.12 kg/m   Weight change: @WEIGHTCHANGE @ Height:   Height: 5\' 8"  (172.7 cm)  Ht Readings from Last 3 Encounters:  12/16/22 5\' 8"  (1.727 m)  09/29/21 5\' 8"  (1.727 m)  07/23/21 5\' 8"  (1.727 m)    General appearance: alert, cooperative and appears stated age Head: Normocephalic, without obvious abnormality, atraumatic Neck: no adenopathy, supple, symmetrical, trachea midline and thyroid normal to inspection and palpation Lungs: clear to auscultation bilaterally Cardiovascular: regular rate and rhythm Breasts: normal appearance, no masses or tenderness Abdomen: soft, non-tender; non  distended,  no masses,  no organomegaly Extremities: extremities normal, atraumatic, no cyanosis or edema Skin: Skin color, texture, turgor normal. No rashes or lesions Lymph nodes: Cervical, supraclavicular, and axillary nodes normal. No abnormal inguinal nodes palpated Neurologic: Grossly normal   Pelvic: External genitalia:  no lesions              Urethra:  normal appearing urethra with no masses, tenderness or lesions              Bartholins and Skenes: normal                 Vagina: normal appearing vagina with normal color and discharge, no lesions              Cervix: no lesions               Bimanual Exam:  Uterus:   no masses or tenderness              Adnexa: no mass, fullness, tenderness               Rectovaginal: Confirms               Anus:  normal sphincter  tone, no lesions  Gae Dry, CMA chaperoned for the exam.  1. Well woman exam Discussed breast self exam Discussed calcium and vit D intake F/U mammogram next week Labs with primary Colonoscopy utd  2. Screening for cervical cancer - Cytology - PAP

## 2022-12-09 ENCOUNTER — Other Ambulatory Visit: Payer: Self-pay | Admitting: Obstetrics and Gynecology

## 2022-12-09 DIAGNOSIS — R928 Other abnormal and inconclusive findings on diagnostic imaging of breast: Secondary | ICD-10-CM

## 2022-12-15 ENCOUNTER — Ambulatory Visit: Payer: BC Managed Care – PPO | Admitting: Obstetrics and Gynecology

## 2022-12-16 ENCOUNTER — Encounter: Payer: Self-pay | Admitting: Obstetrics and Gynecology

## 2022-12-16 ENCOUNTER — Other Ambulatory Visit (HOSPITAL_COMMUNITY)
Admission: RE | Admit: 2022-12-16 | Discharge: 2022-12-16 | Disposition: A | Discharge status: Home / Self Care | Payer: BC Managed Care – PPO | Source: Ambulatory Visit | Attending: Obstetrics and Gynecology | Admitting: Obstetrics and Gynecology

## 2022-12-16 ENCOUNTER — Ambulatory Visit (INDEPENDENT_AMBULATORY_CARE_PROVIDER_SITE_OTHER): Payer: BC Managed Care – PPO | Admitting: Obstetrics and Gynecology

## 2022-12-16 VITALS — BP 122/72 | HR 65 | Ht 68.0 in | Wt 231.0 lb

## 2022-12-16 DIAGNOSIS — Z01419 Encounter for gynecological examination (general) (routine) without abnormal findings: Secondary | ICD-10-CM | POA: Diagnosis not present

## 2022-12-16 DIAGNOSIS — Z124 Encounter for screening for malignant neoplasm of cervix: Secondary | ICD-10-CM | POA: Insufficient documentation

## 2022-12-16 DIAGNOSIS — K9041 Non-celiac gluten sensitivity: Secondary | ICD-10-CM | POA: Insufficient documentation

## 2022-12-16 DIAGNOSIS — Z8262 Family history of osteoporosis: Secondary | ICD-10-CM | POA: Insufficient documentation

## 2022-12-16 DIAGNOSIS — Z1211 Encounter for screening for malignant neoplasm of colon: Secondary | ICD-10-CM | POA: Insufficient documentation

## 2022-12-16 NOTE — Patient Instructions (Signed)

## 2022-12-20 LAB — CYTOLOGY - PAP
Comment: NEGATIVE
Diagnosis: NEGATIVE
Diagnosis: REACTIVE
High risk HPV: NEGATIVE

## 2022-12-21 ENCOUNTER — Ambulatory Visit
Admission: RE | Admit: 2022-12-21 | Discharge: 2022-12-21 | Disposition: A | Discharge status: Home / Self Care | Payer: BC Managed Care – PPO | Source: Ambulatory Visit | Attending: Obstetrics and Gynecology | Admitting: Obstetrics and Gynecology

## 2022-12-21 ENCOUNTER — Other Ambulatory Visit: Payer: Self-pay | Admitting: Obstetrics and Gynecology

## 2022-12-21 DIAGNOSIS — R928 Other abnormal and inconclusive findings on diagnostic imaging of breast: Secondary | ICD-10-CM

## 2022-12-21 DIAGNOSIS — N6489 Other specified disorders of breast: Secondary | ICD-10-CM | POA: Diagnosis not present

## 2022-12-22 ENCOUNTER — Other Ambulatory Visit (HOSPITAL_BASED_OUTPATIENT_CLINIC_OR_DEPARTMENT_OTHER): Payer: Self-pay

## 2022-12-22 MED ORDER — MOUNJARO 12.5 MG/0.5ML ~~LOC~~ SOPN
PEN_INJECTOR | SUBCUTANEOUS | 0 refills | Status: AC
Start: 2022-12-22 — End: ?
  Filled 2023-04-20: qty 2, 28d supply, fill #0

## 2022-12-22 MED ORDER — MOUNJARO 10 MG/0.5ML ~~LOC~~ SOAJ
SUBCUTANEOUS | 0 refills | Status: AC
  Filled 2022-12-22: qty 2, 28d supply, fill #0

## 2022-12-22 MED ORDER — MOUNJARO 15 MG/0.5ML ~~LOC~~ SOAJ
15.0000 mg | SUBCUTANEOUS | 2 refills | Status: AC
  Filled 2023-06-03: qty 2, fill #0
  Filled 2023-06-09 – 2023-06-10 (×2): qty 2, 28d supply, fill #0
  Filled 2023-07-07 – 2023-07-26 (×2): qty 2, 28d supply, fill #1
  Filled 2023-08-31: qty 2, 28d supply, fill #2

## 2022-12-23 ENCOUNTER — Other Ambulatory Visit (HOSPITAL_BASED_OUTPATIENT_CLINIC_OR_DEPARTMENT_OTHER): Payer: Self-pay

## 2022-12-27 DIAGNOSIS — M1712 Unilateral primary osteoarthritis, left knee: Secondary | ICD-10-CM | POA: Diagnosis not present

## 2023-01-17 ENCOUNTER — Other Ambulatory Visit (HOSPITAL_BASED_OUTPATIENT_CLINIC_OR_DEPARTMENT_OTHER): Payer: Self-pay

## 2023-01-17 DIAGNOSIS — D2272 Melanocytic nevi of left lower limb, including hip: Secondary | ICD-10-CM | POA: Diagnosis not present

## 2023-01-17 DIAGNOSIS — L700 Acne vulgaris: Secondary | ICD-10-CM | POA: Diagnosis not present

## 2023-01-17 DIAGNOSIS — D2261 Melanocytic nevi of right upper limb, including shoulder: Secondary | ICD-10-CM | POA: Diagnosis not present

## 2023-01-17 DIAGNOSIS — D225 Melanocytic nevi of trunk: Secondary | ICD-10-CM | POA: Diagnosis not present

## 2023-01-17 MED ORDER — MOUNJARO 7.5 MG/0.5ML ~~LOC~~ SOAJ
SUBCUTANEOUS | 0 refills | Status: AC
  Filled 2023-02-09: qty 2, 28d supply, fill #0

## 2023-01-17 MED ORDER — MOUNJARO 5 MG/0.5ML ~~LOC~~ SOAJ
5.0000 mg | SUBCUTANEOUS | 0 refills | Status: AC
  Filled 2023-01-17: qty 2, 28d supply, fill #0

## 2023-01-25 ENCOUNTER — Other Ambulatory Visit (HOSPITAL_BASED_OUTPATIENT_CLINIC_OR_DEPARTMENT_OTHER): Payer: Self-pay

## 2023-01-25 MED ORDER — ESTRADIOL 0.05 MG/24HR TD PTTW
1.0000 | MEDICATED_PATCH | TRANSDERMAL | 1 refills | Status: AC
  Filled 2023-01-25: qty 24, 84d supply, fill #0
  Filled 2023-04-20: qty 24, 84d supply, fill #1

## 2023-01-31 ENCOUNTER — Other Ambulatory Visit: Payer: Self-pay

## 2023-01-31 ENCOUNTER — Other Ambulatory Visit (HOSPITAL_BASED_OUTPATIENT_CLINIC_OR_DEPARTMENT_OTHER): Payer: Self-pay

## 2023-01-31 ENCOUNTER — Encounter (HOSPITAL_BASED_OUTPATIENT_CLINIC_OR_DEPARTMENT_OTHER): Payer: Self-pay

## 2023-01-31 ENCOUNTER — Emergency Department (HOSPITAL_BASED_OUTPATIENT_CLINIC_OR_DEPARTMENT_OTHER): Payer: BC Managed Care – PPO

## 2023-01-31 ENCOUNTER — Emergency Department (HOSPITAL_BASED_OUTPATIENT_CLINIC_OR_DEPARTMENT_OTHER)
Admission: EM | Admit: 2023-01-31 | Discharge: 2023-01-31 | Disposition: A | Discharge status: Home / Self Care | Payer: BC Managed Care – PPO | Attending: Emergency Medicine | Admitting: Emergency Medicine

## 2023-01-31 DIAGNOSIS — R3129 Other microscopic hematuria: Secondary | ICD-10-CM | POA: Diagnosis not present

## 2023-01-31 DIAGNOSIS — K8020 Calculus of gallbladder without cholecystitis without obstruction: Secondary | ICD-10-CM | POA: Diagnosis not present

## 2023-01-31 DIAGNOSIS — R1011 Right upper quadrant pain: Secondary | ICD-10-CM | POA: Diagnosis not present

## 2023-01-31 DIAGNOSIS — R109 Unspecified abdominal pain: Secondary | ICD-10-CM | POA: Diagnosis not present

## 2023-01-31 DIAGNOSIS — R9431 Abnormal electrocardiogram [ECG] [EKG]: Secondary | ICD-10-CM | POA: Diagnosis not present

## 2023-01-31 LAB — URINALYSIS, ROUTINE W REFLEX MICROSCOPIC
Bilirubin Urine: NEGATIVE
Glucose, UA: NEGATIVE mg/dL
Ketones, ur: NEGATIVE mg/dL
Leukocytes,Ua: NEGATIVE
Nitrite: NEGATIVE
Protein, ur: NEGATIVE mg/dL
Specific Gravity, Urine: 1.021 (ref 1.005–1.030)
pH: 5.5 (ref 5.0–8.0)

## 2023-01-31 LAB — LIPASE, BLOOD: Lipase: 37 U/L (ref 11–51)

## 2023-01-31 LAB — COMPREHENSIVE METABOLIC PANEL
ALT: 17 U/L (ref 0–44)
AST: 15 U/L (ref 15–41)
Albumin: 4.5 g/dL (ref 3.5–5.0)
Alkaline Phosphatase: 86 U/L (ref 38–126)
Anion gap: 7 (ref 5–15)
BUN: 14 mg/dL (ref 6–20)
CO2: 28 mmol/L (ref 22–32)
Calcium: 9.3 mg/dL (ref 8.9–10.3)
Chloride: 99 mmol/L (ref 98–111)
Creatinine, Ser: 0.83 mg/dL (ref 0.44–1.00)
GFR, Estimated: >60 mL/min (ref >60)
Glucose, Bld: 98 mg/dL (ref 70–99)
Potassium: 3.9 mmol/L (ref 3.5–5.1)
Sodium: 134 mmol/L — ABNORMAL LOW (ref 135–145)
Total Bilirubin: 0.6 mg/dL (ref 0.3–1.2)
Total Protein: 7.6 g/dL (ref 6.5–8.1)

## 2023-01-31 LAB — URINALYSIS, MICROSCOPIC (REFLEX): Bacteria, UA: NONE SEEN

## 2023-01-31 LAB — CBC
HCT: 44 % (ref 36.0–46.0)
Hemoglobin: 14.5 g/dL (ref 12.0–15.0)
MCH: 31.5 pg (ref 26.0–34.0)
MCHC: 33 g/dL (ref 30.0–36.0)
MCV: 95.7 fL (ref 80.0–100.0)
Platelets: 288 10*3/uL (ref 150–400)
RBC: 4.6 MIL/uL (ref 3.87–5.11)
RDW: 12.8 % (ref 11.5–15.5)
WBC: 9.1 10*3/uL (ref 4.0–10.5)
nRBC: 0 % (ref 0.0–0.2)

## 2023-01-31 MED ORDER — KETOROLAC TROMETHAMINE 15 MG/ML IJ SOLN: 15.0000 mg | Freq: Once | INTRAMUSCULAR | Status: DC

## 2023-01-31 MED ORDER — DICYCLOMINE HCL 20 MG PO TABS
20.0000 mg | ORAL_TABLET | Freq: Three times a day (TID) | ORAL | 0 refills | Status: AC | PRN
  Filled 2023-01-31: qty 15, 5d supply, fill #0

## 2023-01-31 MED ORDER — ONDANSETRON HCL 4 MG/2ML IJ SOLN
4.0000 mg | Freq: Once | INTRAMUSCULAR | Status: AC
  Administered 2023-01-31: 4 mg via INTRAVENOUS
  Filled 2023-01-31: qty 2

## 2023-01-31 MED ORDER — ONDANSETRON HCL 4 MG PO TABS
4.0000 mg | ORAL_TABLET | Freq: Three times a day (TID) | ORAL | 0 refills | Status: AC | PRN
  Filled 2023-01-31: qty 12, 4d supply, fill #0

## 2023-01-31 MED ORDER — KETOROLAC TROMETHAMINE 15 MG/ML IJ SOLN
15.0000 mg | Freq: Once | INTRAMUSCULAR | Status: AC
  Administered 2023-01-31: 15 mg via INTRAVENOUS
  Filled 2023-01-31: qty 1

## 2023-01-31 NOTE — ED Provider Notes (Signed)
[place] EMERGENCY DEPARTMENT AT [facility] Provider Note   CSN: [id number] Arrival date & time: 01/31/23  1052     History  Chief Complaint  Patient presents with   Abdominal Pain    Katrina Hernandez is a 57 y.o. female with past medical history IBS, hyperlipidemia, vitamin B12 deficiency who presents to the ED complaining of right upper quadrant pain that has been intermittent for the last 2 weeks.  She says it has been worse today.  Remote history of gallstones diagnosed in the ED approximately 8 years ago.  Previous uterine ablation but no other abdominal surgeries.  Some minor associated nausea but no vomiting, diarrhea, constipation, fever, dysuria or hematuria or back pain, vaginal bleeding or discharge, chest pain, or shortness of breath.      Home Medications Prior to Admission medications   Medication Sig Start Date End Date Taking? Authorizing Provider  dicyclomine (BENTYL) 20 MG tablet Take 1 tablet (20 mg total) by mouth 3 (three) times daily as needed for up to 5 days for spasms. 01/31/23 02/05/23 Yes Ishana Blades L, PA-C  ondansetron (ZOFRAN) 4 MG tablet Take 1 tablet (4 mg total) by mouth every 8 (eight) hours as needed for up to 5 days for nausea or vomiting. 01/31/23 02/05/23 Yes Contrina Orona L, PA-C  cetirizine (ZYRTEC) 10 MG tablet Take 10 mg by mouth daily as needed for allergies.    [provider]  Cholecalciferol (VITAMIN D) 2000 UNITS CAPS Take 1 tablet by mouth daily. 06/15/11   [provider]  estradiol (VIVELLE-DOT) 0.05 MG/24HR patch Apply 1 patch to the skin 2 times per week 08/02/22     estradiol (VIVELLE-DOT) 0.05 MG/24HR patch Place 1 patch (0.05 mg total) onto the skin 2 (two) times a week. 01/27/23     meloxicam (MOBIC) 15 MG tablet Take 15 mg by mouth daily. 07/21/21   [provider]  Probiotic Product (PROBIOTIC ADVANCED PO) Take by mouth.    [provider]  progesterone (PROMETRIUM) 100 MG capsule  Take 100 mg by mouth at bedtime. 07/18/21   [provider]  Testosterone 20 % CREA by Does not apply route.    [provider]  tirzepatide [facility]) 10 MG/0.5ML Pen Inject 10 mg subcutaneously weekly x 4 weeks then increase to 12.5mg  weekly 12/22/22     tirzepatide [facility]) 12.5 MG/0.5ML Pen Inject 12.5 mg subcutaneously weekly for 4 weeks 12/22/22     tirzepatide [facility]) 15 MG/0.5ML Pen Inject 15 mg subcutaneously weekly 12/22/22     tirzepatide (MOUNJARO) 5 MG/0.5ML Pen Inject 5 mg into the skin once a week. 01/17/23     tirzepatide (MOUNJARO) 7.5 MG/0.5ML Pen 7.5 mg subcutaneously weekly x 4 weeks then increase to 10mg  01/17/23     Zinc 30 MG TABS Take by mouth.    [provider]      Allergies    Tetracyclines & related    Review of Systems   Review of Systems  All other systems reviewed and are negative.   Physical Exam Updated Vital Signs BP 128/85 (BP Location: Right Arm)   Pulse 69   Temp 98.3 F (36.8 C)   Resp 16   Ht 5\' 8"  (1.727 m)   Wt 104.8 kg   SpO2 99%   BMI 35.13 kg/m  Physical Exam Vitals and nursing note reviewed.  Constitutional:      General: She is not in acute distress.    Appearance: Normal appearance. She is not  ill-appearing, toxic-appearing or diaphoretic.  HENT:     Head: Normocephalic and atraumatic.     Mouth/Throat:     Mouth: Mucous membranes are moist.  Eyes:     Conjunctiva/sclera: Conjunctivae normal.  Cardiovascular:     Rate and Rhythm: Normal rate and regular rhythm.     Heart sounds: No murmur heard. Pulmonary:     Effort: Pulmonary effort is normal.     Breath sounds: Normal breath sounds.  Abdominal:     General: Abdomen is flat.     Palpations: Abdomen is soft. There is no shifting dullness, fluid wave or pulsatile mass.     Tenderness: There is no abdominal tenderness. There is no right CVA tenderness, left CVA tenderness, guarding or rebound. Negative signs include Murphy's sign and McBurney's  sign.  Musculoskeletal:        General: Normal range of motion.     Cervical back: Neck supple.     Right lower leg: No edema.     Left lower leg: No edema.  Skin:    General: Skin is warm and dry.     Capillary Refill: Capillary refill takes less than 2 seconds.  Neurological:     Mental Status: She is alert. Mental status is at baseline.  Psychiatric:        Mood and Affect: Mood normal.        Behavior: Behavior normal.     ED Results / Procedures / Treatments   Labs (all labs ordered are listed, but only abnormal results are displayed) Labs Reviewed  COMPREHENSIVE METABOLIC PANEL - Abnormal; Notable for the following components:      Result Value   Sodium 134 (*)    All other components within normal limits  URINALYSIS, ROUTINE W REFLEX MICROSCOPIC - Abnormal; Notable for the following components:   Hgb urine dipstick MODERATE (*)    All other components within normal limits  LIPASE, BLOOD  CBC  URINALYSIS, MICROSCOPIC (REFLEX)    EKG NSR at 66bpm, no acute ST-T changes  Radiology CT ABDOMEN PELVIS WO CONTRAST  Result Date: 01/31/2023 CLINICAL DATA:  Acute right lower quadrant abdominal pain. EXAM: CT ABDOMEN AND PELVIS WITHOUT CONTRAST TECHNIQUE: Multidetector CT imaging of the abdomen and pelvis was performed following the standard protocol without IV contrast. RADIATION DOSE REDUCTION: This exam was performed according to the departmental dose-optimization program which includes automated exposure control, adjustment of the mA and/or kV according to patient size and/or use of iterative reconstruction technique. COMPARISON:  Ultrasound of same day. FINDINGS: Lower chest: No acute abnormality. Hepatobiliary: Cholelithiasis. No biliary dilatation. Liver is unremarkable on these unenhanced images. Pancreas: Unremarkable. No pancreatic ductal dilatation or surrounding inflammatory changes. Spleen: Normal in size without focal abnormality. Adrenals/Urinary Tract: Adrenal  glands are unremarkable. Kidneys are normal, without renal calculi, focal lesion, or hydronephrosis. Bladder is unremarkable. Stomach/Bowel: Stomach is within normal limits. Appendix appears normal. No evidence of bowel wall thickening, distention, or inflammatory changes. Vascular/Lymphatic: No significant vascular findings are present. No enlarged abdominal or pelvic lymph nodes. Reproductive: Small calcified uterine fibroid is noted. No adnexal abnormality is noted. Other: No abdominal wall hernia or abnormality. No abdominopelvic ascites. Musculoskeletal: No acute or significant osseous findings. IMPRESSION: Cholelithiasis. Small calcified uterine fibroid. No acute abnormality seen in the abdomen or pelvis. Electronically Signed   By: Lupita Raider M.D.   On: 01/31/2023 13:19   US Abdomen Limited RUQ (LIVER/GB)  Result Date: 01/31/2023 CLINICAL DATA:  151471 RUQ pain 151471 EXAM:  ULTRASOUND ABDOMEN LIMITED RIGHT UPPER QUADRANT COMPARISON:  Ultrasound 02/17/2015 FINDINGS: Gallbladder: Cholelithiasis with multiple intraluminal stones, largest stone measures 2.4 cm. The gallbladder is nondilated. No wall thickening. Negative sonographic Murphy sign. Common bile duct: Diameter: 5.2 mm, normal.  No intrahepatic ductal dilation. Liver: No focal lesion identified. Within normal limits in parenchymal echogenicity. Portal vein is patent on color Doppler imaging with normal direction of blood flow towards the liver. Other: None. IMPRESSION: Cholelithiasis with multiple intraluminal stones, largest measuring 2.4 cm. No evidence of acute cholecystitis. Electronically Signed   By: Caprice Renshaw M.D.   On: 01/31/2023 12:08    Procedures Procedures    Medications Ordered in ED Medications  ketorolac (TORADOL) 15 MG/ML injection 15 mg (15 mg Intravenous Given 01/31/23 1245)  ondansetron (ZOFRAN) injection 4 mg (4 mg Intravenous Given 01/31/23 1245)    ED Course/ Medical Decision Making/ A&P                              Medical Decision Making Amount and/or Complexity of Data Reviewed Labs: ordered. Decision-making details documented in ED Course. Radiology: ordered. Decision-making details documented in ED Course. ECG/medicine tests: ordered. Decision-making details documented in ED Course.  Risk Prescription drug management.   Medical Decision Making:   JOLIETTE MCQUITTY is a 57 y.o. female who presented to the ED today with abdominal pain detailed above.    Patient's presentation is complicated by their history of gallstones, obesity.  Complete initial physical exam performed, notably the patient  was in no acute distress.  Abdomen soft, nontender, nondistended.  No rebound, guarding, or peritoneal signs.  Negative McBurney's point tenderness.  Negative Murphy sign.  No CVA tenderness.    Reviewed and confirmed nursing documentation for past medical history, family history, social history.    Initial Assessment:   With the patient's presentation of abdominal pain, differential diagnosis includes but is not limited to AAA, mesenteric ischemia, appendicitis, diverticulitis, DKA, gastritis, gastroenteritis, AMI, nephrolithiasis, pancreatitis, peritonitis, adrenal insufficiency, intestinal ischemia, constipation, UTI, SBO/LBO, splenic rupture, biliary disease, IBD, IBS, PUD, hepatitis, STD, ovarian/testicular torsion, electrolyte disturbance, DKA, dehydration, acute kidney injury, renal failure, cholecystitis, cholelithiasis, choledocholithiasis, abdominal pain of  unknown etiology.    Initial Plan:  Screening labs including CBC and Metabolic panel to evaluate for infectious or metabolic etiology of disease.  Lipase to evaluate for pancreatitis Urinalysis with reflex culture ordered to evaluate for UTI or relevant urologic/nephrologic pathology.  RUQ [place] to evaluate for intra-abdominal pathology EKG to evaluate for cardiac pathology Symptomatic management, initially declined by patient Objective  evaluation as reviewed   Initial Study Results:   Laboratory  All laboratory results reviewed without evidence of clinically relevant pathology.   Exceptions include: NA 134, UA positive for hematuria  EKG EKG was reviewed independently. ST segments without concerns for elevations.   EKG: normal sinus rhythm.   Radiology:  All images reviewed independently. Agree with radiology report at this time.   CT ABDOMEN PELVIS WO CONTRAST  Result Date: 01/31/2023 CLINICAL DATA:  Acute right lower quadrant abdominal pain. EXAM: CT ABDOMEN AND PELVIS WITHOUT CONTRAST TECHNIQUE: Multidetector CT imaging of the abdomen and pelvis was performed following the standard protocol without IV contrast. RADIATION DOSE REDUCTION: This exam was performed according to the departmental dose-optimization program which includes automated exposure control, adjustment of the mA and/or kV according to patient size and/or use of iterative reconstruction technique. COMPARISON:  Ultrasound of same day. FINDINGS: Lower  chest: No acute abnormality. Hepatobiliary: Cholelithiasis. No biliary dilatation. Liver is unremarkable on these unenhanced images. Pancreas: Unremarkable. No pancreatic ductal dilatation or surrounding inflammatory changes. Spleen: Normal in size without focal abnormality. Adrenals/Urinary Tract: Adrenal glands are unremarkable. Kidneys are normal, without renal calculi, focal lesion, or hydronephrosis. Bladder is unremarkable. Stomach/Bowel: Stomach is within normal limits. Appendix appears normal. No evidence of bowel wall thickening, distention, or inflammatory changes. Vascular/Lymphatic: No significant vascular findings are present. No enlarged abdominal or pelvic lymph nodes. Reproductive: Small calcified uterine fibroid is noted. No adnexal abnormality is noted. Other: No abdominal wall hernia or abnormality. No abdominopelvic ascites. Musculoskeletal: No acute or significant osseous findings. IMPRESSION:  Cholelithiasis. Small calcified uterine fibroid. No acute abnormality seen in the abdomen or pelvis. Electronically Signed   By: Lupita Raider M.D.   On: 01/31/2023 13:19   US Abdomen Limited RUQ (LIVER/GB)  Result Date: 01/31/2023 CLINICAL DATA:  151471 RUQ pain 151471 EXAM: ULTRASOUND ABDOMEN LIMITED RIGHT UPPER QUADRANT COMPARISON:  Ultrasound 02/17/2015 FINDINGS: Gallbladder: Cholelithiasis with multiple intraluminal stones, largest stone measures 2.4 cm. The gallbladder is nondilated. No wall thickening. Negative sonographic Murphy sign. Common bile duct: Diameter: 5.2 mm, normal.  No intrahepatic ductal dilation. Liver: No focal lesion identified. Within normal limits in parenchymal echogenicity. Portal vein is patent on color Doppler imaging with normal direction of blood flow towards the liver. Other: None. IMPRESSION: Cholelithiasis with multiple intraluminal stones, largest measuring 2.4 cm. No evidence of acute cholecystitis. Electronically Signed   By: Caprice Renshaw M.D.   On: 01/31/2023 12:08      Final Assessment and Plan:   ***    Clinical Impression:  1. Calculus of gallbladder without cholecystitis without obstruction   2. Microscopic hematuria      Discharge    {Document critical care time when appropriate:1} {Document review of labs and clinical decision tools ie heart score, Chads2Vasc2 etc:1}  {Document your independent review of radiology images, and any outside records:1} {Document your discussion with family members, caretakers, and with consultants:1} {Document social determinants of health affecting pt's care:1} {Document your decision making why or why not admission, treatments were needed:1} Final Clinical Impression(s) / ED Diagnoses Final diagnoses:  Calculus of gallbladder without cholecystitis without obstruction  Microscopic hematuria    Rx / DC Orders ED Discharge Orders          Ordered    ondansetron (ZOFRAN) 4 MG tablet  Every 8 hours PRN         01/31/23 1347    dicyclomine (BENTYL) 20 MG tablet  3 times daily PRN        01/31/23 1347

## 2023-01-31 NOTE — ED Notes (Signed)
Patient given discharge instructions. Questions were answered. Patient verbalized understanding of discharge instructions and care at home.  

## 2023-01-31 NOTE — ED Notes (Signed)
Patient transported to Ultrasound 

## 2023-01-31 NOTE — ED Triage Notes (Signed)
Patient arrives with complaints of intermittent RLQ abdominal pain x2 weeks. Pain has worsened today and patient is concerned it may be her gallbladder. Rates pain a 3/10. Also reports her bowel movements small different as well as fatigue.   Reports that she had gallstones several years ago.

## 2023-01-31 NOTE — Discharge Instructions (Signed)
Thank you for letting us take care of you today.  Overall, your workup was reassuring.  We do see gallstones on the ultrasound and CT but no surrounding infection or other emergent process requiring hospitalization or surgery.  I provided surgery for you to follow-up with especially if you continue to have symptoms as they may want to remove your gallbladder.  I also recommend you follow-up with your PCP within the next week for reassessment.  It also may be beneficial to have your urine rechecked as it has microscopic blood in it today.  If you continue to have this, sometimes you need to be referred to urology for further evaluation.  I prescribed 2 medications, 1 to help with pain and 1 to help with nausea.  Please take these as needed in addition to over-the-counter medications.  For any new or worsening symptoms, please return to the nearest ED for reevaluation.

## 2023-02-09 ENCOUNTER — Other Ambulatory Visit (HOSPITAL_BASED_OUTPATIENT_CLINIC_OR_DEPARTMENT_OTHER): Payer: Self-pay

## 2023-04-20 ENCOUNTER — Other Ambulatory Visit (HOSPITAL_BASED_OUTPATIENT_CLINIC_OR_DEPARTMENT_OTHER): Payer: Self-pay

## 2023-04-22 ENCOUNTER — Other Ambulatory Visit: Payer: Self-pay

## 2023-04-26 ENCOUNTER — Other Ambulatory Visit: Payer: Self-pay

## 2023-06-03 ENCOUNTER — Other Ambulatory Visit (HOSPITAL_BASED_OUTPATIENT_CLINIC_OR_DEPARTMENT_OTHER): Payer: Self-pay

## 2023-06-09 ENCOUNTER — Other Ambulatory Visit (HOSPITAL_BASED_OUTPATIENT_CLINIC_OR_DEPARTMENT_OTHER): Payer: Self-pay

## 2023-06-10 ENCOUNTER — Other Ambulatory Visit (HOSPITAL_BASED_OUTPATIENT_CLINIC_OR_DEPARTMENT_OTHER): Payer: Self-pay

## 2023-06-23 ENCOUNTER — Ambulatory Visit
Admission: RE | Admit: 2023-06-23 | Discharge: 2023-06-23 | Disposition: A | Discharge status: Home / Self Care | Payer: BC Managed Care – PPO | Source: Ambulatory Visit | Attending: Obstetrics and Gynecology | Admitting: Obstetrics and Gynecology

## 2023-06-23 ENCOUNTER — Other Ambulatory Visit: Payer: Self-pay | Admitting: Obstetrics and Gynecology

## 2023-06-23 DIAGNOSIS — R928 Other abnormal and inconclusive findings on diagnostic imaging of breast: Secondary | ICD-10-CM | POA: Diagnosis not present

## 2023-06-23 DIAGNOSIS — N6489 Other specified disorders of breast: Secondary | ICD-10-CM

## 2023-07-08 ENCOUNTER — Other Ambulatory Visit (HOSPITAL_BASED_OUTPATIENT_CLINIC_OR_DEPARTMENT_OTHER): Payer: Self-pay

## 2023-07-19 ENCOUNTER — Other Ambulatory Visit (HOSPITAL_BASED_OUTPATIENT_CLINIC_OR_DEPARTMENT_OTHER): Payer: Self-pay

## 2023-07-26 ENCOUNTER — Other Ambulatory Visit (HOSPITAL_BASED_OUTPATIENT_CLINIC_OR_DEPARTMENT_OTHER): Payer: Self-pay

## 2023-10-03 ENCOUNTER — Other Ambulatory Visit (HOSPITAL_BASED_OUTPATIENT_CLINIC_OR_DEPARTMENT_OTHER): Payer: Self-pay

## 2023-10-03 MED ORDER — MOUNJARO 12.5 MG/0.5ML ~~LOC~~ SOAJ
12.5000 mg | SUBCUTANEOUS | 1 refills | Status: DC
  Filled 2023-10-03: qty 2, 28d supply, fill #0
  Filled 2023-11-25: qty 2, 28d supply, fill #1

## 2023-11-25 ENCOUNTER — Other Ambulatory Visit (HOSPITAL_BASED_OUTPATIENT_CLINIC_OR_DEPARTMENT_OTHER): Payer: Self-pay

## 2023-12-16 ENCOUNTER — Encounter: Payer: Self-pay | Admitting: Obstetrics and Gynecology

## 2023-12-16 ENCOUNTER — Ambulatory Visit
Admission: RE | Admit: 2023-12-16 | Discharge: 2023-12-16 | Disposition: A | Discharge status: Home / Self Care | Payer: BC Managed Care – PPO | Source: Ambulatory Visit | Attending: Obstetrics and Gynecology | Admitting: Obstetrics and Gynecology

## 2023-12-16 DIAGNOSIS — N6489 Other specified disorders of breast: Secondary | ICD-10-CM

## 2023-12-27 ENCOUNTER — Other Ambulatory Visit (HOSPITAL_BASED_OUTPATIENT_CLINIC_OR_DEPARTMENT_OTHER): Payer: Self-pay

## 2023-12-27 MED ORDER — MOUNJARO 12.5 MG/0.5ML ~~LOC~~ SOAJ
12.5000 mg | SUBCUTANEOUS | 2 refills | Status: AC
  Filled 2023-12-27: qty 2, 28d supply, fill #0
  Filled 2024-02-07: qty 2, 28d supply, fill #1
  Filled 2024-03-03: qty 2, 28d supply, fill #2

## 2024-02-07 ENCOUNTER — Other Ambulatory Visit (HOSPITAL_COMMUNITY): Payer: Self-pay

## 2024-02-07 ENCOUNTER — Other Ambulatory Visit: Payer: Self-pay

## 2024-02-09 ENCOUNTER — Other Ambulatory Visit (HOSPITAL_BASED_OUTPATIENT_CLINIC_OR_DEPARTMENT_OTHER): Payer: Self-pay

## 2024-03-05 ENCOUNTER — Other Ambulatory Visit (HOSPITAL_BASED_OUTPATIENT_CLINIC_OR_DEPARTMENT_OTHER): Payer: Self-pay

## 2024-03-28 ENCOUNTER — Other Ambulatory Visit (HOSPITAL_BASED_OUTPATIENT_CLINIC_OR_DEPARTMENT_OTHER): Payer: Self-pay

## 2024-03-28 MED ORDER — ZEPBOUND 12.5 MG/0.5ML ~~LOC~~ SOAJ
12.5000 mg | SUBCUTANEOUS | 3 refills | Status: AC
  Filled 2024-03-28: qty 2, 28d supply, fill #0
  Filled 2024-05-07: qty 2, 28d supply, fill #1
  Filled 2024-06-12: qty 2, 28d supply, fill #2
  Filled 2024-07-06: qty 2, 28d supply, fill #3

## 2024-03-29 ENCOUNTER — Other Ambulatory Visit (HOSPITAL_BASED_OUTPATIENT_CLINIC_OR_DEPARTMENT_OTHER): Payer: Self-pay

## 2024-03-30 ENCOUNTER — Other Ambulatory Visit (HOSPITAL_BASED_OUTPATIENT_CLINIC_OR_DEPARTMENT_OTHER): Payer: Self-pay

## 2024-05-07 ENCOUNTER — Other Ambulatory Visit (HOSPITAL_BASED_OUTPATIENT_CLINIC_OR_DEPARTMENT_OTHER): Payer: Self-pay

## 2024-05-08 ENCOUNTER — Other Ambulatory Visit (HOSPITAL_BASED_OUTPATIENT_CLINIC_OR_DEPARTMENT_OTHER): Payer: Self-pay

## 2024-06-12 ENCOUNTER — Other Ambulatory Visit: Payer: Self-pay

## 2024-07-11 ENCOUNTER — Other Ambulatory Visit (HOSPITAL_BASED_OUTPATIENT_CLINIC_OR_DEPARTMENT_OTHER): Payer: Self-pay

## 2024-08-27 ENCOUNTER — Other Ambulatory Visit (HOSPITAL_BASED_OUTPATIENT_CLINIC_OR_DEPARTMENT_OTHER): Payer: Self-pay

## 2024-08-27 MED ORDER — ZEPBOUND 10 MG/0.5ML ~~LOC~~ SOAJ
0.5000 mL | SUBCUTANEOUS | 3 refills | Status: AC
  Filled 2024-08-27: qty 2, 28d supply, fill #0
  Filled 2024-10-01 – 2024-10-17 (×2): qty 2, 28d supply, fill #1

## 2024-10-01 ENCOUNTER — Other Ambulatory Visit (HOSPITAL_BASED_OUTPATIENT_CLINIC_OR_DEPARTMENT_OTHER): Payer: Self-pay

## 2024-10-05 ENCOUNTER — Other Ambulatory Visit (HOSPITAL_BASED_OUTPATIENT_CLINIC_OR_DEPARTMENT_OTHER): Payer: Self-pay

## 2024-10-16 ENCOUNTER — Other Ambulatory Visit (HOSPITAL_BASED_OUTPATIENT_CLINIC_OR_DEPARTMENT_OTHER): Payer: Self-pay

## 2024-10-17 ENCOUNTER — Other Ambulatory Visit (HOSPITAL_BASED_OUTPATIENT_CLINIC_OR_DEPARTMENT_OTHER): Payer: Self-pay
